# Patient Record
Sex: Male | Born: 1945 | Race: White | Hispanic: No | Marital: Married | Smoking: Former smoker
Health system: Southern US, Community
[De-identification: ages and names within clinical notes are randomized; demographics above are authoritative.]

## PROBLEM LIST (undated history)

## (undated) DIAGNOSIS — F329 Major depressive disorder, single episode, unspecified: Secondary | ICD-10-CM

## (undated) DIAGNOSIS — I209 Angina pectoris, unspecified: Secondary | ICD-10-CM

## (undated) DIAGNOSIS — F32A Depression, unspecified: Secondary | ICD-10-CM

## (undated) DIAGNOSIS — M1990 Unspecified osteoarthritis, unspecified site: Secondary | ICD-10-CM

## (undated) DIAGNOSIS — G20 Parkinson's disease: Secondary | ICD-10-CM

## (undated) DIAGNOSIS — I519 Heart disease, unspecified: Secondary | ICD-10-CM

## (undated) HISTORY — PX: SHOULDER SURGERY: SHX246

## (undated) HISTORY — DX: Major depressive disorder, single episode, unspecified: F32.9

## (undated) HISTORY — DX: Angina pectoris, unspecified: I20.9

## (undated) HISTORY — PX: KNEE ARTHROSCOPY: SHX127

## (undated) HISTORY — PX: APPENDECTOMY: SHX54

## (undated) HISTORY — DX: Heart disease, unspecified: I51.9

## (undated) HISTORY — PX: CARDIAC SURGERY: SHX584

## (undated) HISTORY — PX: TONSILLECTOMY: SHX5217

## (undated) HISTORY — DX: Unspecified osteoarthritis, unspecified site: M19.90

## (undated) HISTORY — PX: EYE SURGERY: SHX253

## (undated) HISTORY — DX: Depression, unspecified: F32.A

---

## 2002-11-26 DIAGNOSIS — I219 Acute myocardial infarction, unspecified: Secondary | ICD-10-CM

## 2004-08-03 ENCOUNTER — Ambulatory Visit: Payer: Self-pay | Admitting: Internal Medicine

## 2006-09-06 ENCOUNTER — Ambulatory Visit: Payer: Self-pay | Admitting: Unknown Physician Specialty

## 2006-12-15 ENCOUNTER — Ambulatory Visit: Payer: Self-pay

## 2008-09-09 ENCOUNTER — Ambulatory Visit: Payer: Self-pay | Admitting: Internal Medicine

## 2009-04-24 ENCOUNTER — Ambulatory Visit: Payer: Self-pay | Admitting: Internal Medicine

## 2009-07-24 ENCOUNTER — Encounter: Payer: Self-pay | Admitting: Internal Medicine

## 2010-04-30 ENCOUNTER — Ambulatory Visit: Payer: Self-pay | Admitting: Internal Medicine

## 2010-12-03 ENCOUNTER — Ambulatory Visit: Payer: Self-pay | Admitting: Unknown Physician Specialty

## 2010-12-15 ENCOUNTER — Other Ambulatory Visit: Payer: Self-pay

## 2010-12-16 ENCOUNTER — Ambulatory Visit: Payer: Self-pay | Admitting: Internal Medicine

## 2012-05-22 ENCOUNTER — Ambulatory Visit: Payer: Self-pay | Admitting: Internal Medicine

## 2012-07-05 ENCOUNTER — Ambulatory Visit: Payer: Self-pay | Admitting: Surgery

## 2012-07-12 ENCOUNTER — Ambulatory Visit: Payer: Self-pay | Admitting: Surgery

## 2012-07-17 ENCOUNTER — Ambulatory Visit: Payer: Self-pay | Admitting: Surgery

## 2012-07-19 LAB — PATHOLOGY REPORT

## 2012-10-30 DIAGNOSIS — I2510 Atherosclerotic heart disease of native coronary artery without angina pectoris: Secondary | ICD-10-CM | POA: Insufficient documentation

## 2012-10-30 DIAGNOSIS — H269 Unspecified cataract: Secondary | ICD-10-CM | POA: Insufficient documentation

## 2013-03-11 ENCOUNTER — Inpatient Hospital Stay: Payer: Self-pay | Admitting: Internal Medicine

## 2013-03-11 LAB — COMPREHENSIVE METABOLIC PANEL
Albumin: 3.4 g/dL (ref 3.4–5.0)
Alkaline Phosphatase: 90 U/L (ref 50–136)
Bilirubin,Total: 0.3 mg/dL (ref 0.2–1.0)
Calcium, Total: 8.6 mg/dL (ref 8.5–10.1)
Creatinine: 0.95 mg/dL (ref 0.60–1.30)
Osmolality: 275 (ref 275–301)
SGOT(AST): 16 U/L (ref 15–37)
SGPT (ALT): 9 U/L — ABNORMAL LOW (ref 12–78)
Sodium: 137 mmol/L (ref 136–145)
Total Protein: 6.4 g/dL (ref 6.4–8.2)

## 2013-03-11 LAB — CK TOTAL AND CKMB (NOT AT ARMC)
CK, Total: 48 U/L (ref 35–232)
CK-MB: <0.5 ng/mL — ABNORMAL LOW (ref 0.5–3.6)

## 2013-03-11 LAB — CBC
MCH: 29.5 pg (ref 26.0–34.0)
Platelet: 170 10*3/uL (ref 150–440)
RDW: 13.8 % (ref 11.5–14.5)

## 2013-03-12 LAB — CBC WITH DIFFERENTIAL/PLATELET
Basophil #: 0 10*3/uL (ref 0.0–0.1)
Basophil %: 0.4 %
Eosinophil #: 0.1 10*3/uL (ref 0.0–0.7)
HGB: 13 g/dL (ref 13.0–18.0)
Lymphocyte #: 0.9 10*3/uL — ABNORMAL LOW (ref 1.0–3.6)
Lymphocyte %: 20.8 %
MCH: 30.5 pg (ref 26.0–34.0)
MCHC: 34.4 g/dL (ref 32.0–36.0)
MCV: 89 fL (ref 80–100)
Monocyte %: 13.2 %
Neutrophil #: 2.7 10*3/uL (ref 1.4–6.5)
Neutrophil %: 63.8 %
RBC: 4.26 10*6/uL — ABNORMAL LOW (ref 4.40–5.90)
RDW: 14 % (ref 11.5–14.5)
WBC: 4.3 10*3/uL (ref 3.8–10.6)

## 2013-03-12 LAB — CK TOTAL AND CKMB (NOT AT ARMC)
CK, Total: 45 U/L (ref 35–232)
CK-MB: <0.5 ng/mL — ABNORMAL LOW (ref 0.5–3.6)

## 2013-03-12 LAB — BASIC METABOLIC PANEL
BUN: 9 mg/dL (ref 7–18)
Calcium, Total: 8.5 mg/dL (ref 8.5–10.1)
Chloride: 109 mmol/L — ABNORMAL HIGH (ref 98–107)
Creatinine: 0.88 mg/dL (ref 0.60–1.30)
EGFR (African American): >60
Osmolality: 280 (ref 275–301)
Sodium: 141 mmol/L (ref 136–145)

## 2013-03-12 LAB — LIPID PANEL
Ldl Cholesterol, Calc: 39 mg/dL (ref 0–100)
VLDL Cholesterol, Calc: 12 mg/dL (ref 5–40)

## 2013-03-13 LAB — CBC WITH DIFFERENTIAL/PLATELET
Basophil #: 0 10*3/uL (ref 0.0–0.1)
Basophil %: 0.3 %
Eosinophil %: 4 %
HCT: 37.1 % — ABNORMAL LOW (ref 40.0–52.0)
HGB: 12.6 g/dL — ABNORMAL LOW (ref 13.0–18.0)
Lymphocyte #: 1.4 10*3/uL (ref 1.0–3.6)
Lymphocyte %: 34.8 %
MCHC: 34.1 g/dL (ref 32.0–36.0)
MCV: 88 fL (ref 80–100)
Monocyte #: 0.5 x10 3/mm (ref 0.2–1.0)
Neutrophil #: 1.9 10*3/uL (ref 1.4–6.5)
Platelet: 156 10*3/uL (ref 150–440)
RBC: 4.2 10*6/uL — ABNORMAL LOW (ref 4.40–5.90)
RDW: 13.8 % (ref 11.5–14.5)
WBC: 4 10*3/uL (ref 3.8–10.6)

## 2013-03-13 LAB — BASIC METABOLIC PANEL
Anion Gap: 6 — ABNORMAL LOW (ref 7–16)
Calcium, Total: 8.5 mg/dL (ref 8.5–10.1)
Chloride: 110 mmol/L — ABNORMAL HIGH (ref 98–107)
Co2: 26 mmol/L (ref 21–32)
Glucose: 91 mg/dL (ref 65–99)
Osmolality: 281 (ref 275–301)
Potassium: 3.8 mmol/L (ref 3.5–5.1)

## 2013-04-18 ENCOUNTER — Ambulatory Visit: Payer: Self-pay | Admitting: Internal Medicine

## 2013-06-12 DIAGNOSIS — R443 Hallucinations, unspecified: Secondary | ICD-10-CM | POA: Insufficient documentation

## 2013-07-31 ENCOUNTER — Encounter: Payer: Self-pay | Admitting: Internal Medicine

## 2013-09-25 ENCOUNTER — Encounter: Payer: Self-pay | Admitting: Internal Medicine

## 2013-09-27 ENCOUNTER — Encounter: Payer: Self-pay | Admitting: Internal Medicine

## 2013-10-28 ENCOUNTER — Encounter: Payer: Self-pay | Admitting: Internal Medicine

## 2013-11-05 ENCOUNTER — Emergency Department: Payer: Self-pay | Admitting: Emergency Medicine

## 2013-11-05 LAB — COMPREHENSIVE METABOLIC PANEL
ALK PHOS: 83 U/L
ANION GAP: 4 — AB (ref 7–16)
Albumin: 3.6 g/dL (ref 3.4–5.0)
BUN: 17 mg/dL (ref 7–18)
Bilirubin,Total: 0.4 mg/dL (ref 0.2–1.0)
CALCIUM: 9.1 mg/dL (ref 8.5–10.1)
Chloride: 106 mmol/L (ref 98–107)
Co2: 28 mmol/L (ref 21–32)
Creatinine: 0.92 mg/dL (ref 0.60–1.30)
EGFR (African American): >60
Glucose: 96 mg/dL (ref 65–99)
Osmolality: 277 (ref 275–301)
POTASSIUM: 4 mmol/L (ref 3.5–5.1)
SGOT(AST): 16 U/L (ref 15–37)
SGPT (ALT): <6 U/L — ABNORMAL LOW (ref 12–78)
SODIUM: 138 mmol/L (ref 136–145)
Total Protein: 6.8 g/dL (ref 6.4–8.2)

## 2013-11-05 LAB — APTT: ACTIVATED PTT: 27.9 s (ref 23.6–35.9)

## 2013-11-05 LAB — SEDIMENTATION RATE: ERYTHROCYTE SED RATE: 4 mm/h (ref 0–20)

## 2013-11-05 LAB — TROPONIN I: Troponin-I: <0.02 ng/mL

## 2013-11-05 LAB — CBC
HCT: 41.6 % (ref 40.0–52.0)
HGB: 13.7 g/dL (ref 13.0–18.0)
MCH: 30 pg (ref 26.0–34.0)
MCHC: 32.9 g/dL (ref 32.0–36.0)
MCV: 91 fL (ref 80–100)
PLATELETS: 214 10*3/uL (ref 150–440)
RBC: 4.56 10*6/uL (ref 4.40–5.90)
RDW: 13.5 % (ref 11.5–14.5)
WBC: 10.1 10*3/uL (ref 3.8–10.6)

## 2013-11-25 ENCOUNTER — Encounter: Payer: Self-pay | Admitting: Internal Medicine

## 2014-04-18 DIAGNOSIS — D649 Anemia, unspecified: Secondary | ICD-10-CM | POA: Insufficient documentation

## 2014-07-16 ENCOUNTER — Encounter: Payer: Self-pay | Admitting: Internal Medicine

## 2014-12-24 ENCOUNTER — Encounter
Admit: 2014-12-24 | Disposition: A | Discharge status: Home / Self Care | Payer: Self-pay | Attending: Neurology | Admitting: Neurology

## 2014-12-27 ENCOUNTER — Encounter
Admit: 2014-12-27 | Disposition: A | Discharge status: Home / Self Care | Payer: Self-pay | Attending: Neurology | Admitting: Neurology

## 2015-01-14 NOTE — Op Note (Signed)
PATIENT NAME:  Consepcion HearingJOBE, [name] MR#:  [id number] DATE OF BIRTH:  [birth year]  DATE OF PROCEDURE:  07/17/2012  PREOPERATIVE DIAGNOSIS: Right subscapular elastoma.   POSTOPERATIVE DIAGNOSIS: Right subscapular elastoma.   PROCEDURE: Excision of elastoma of right upper back.   SURGEON: Renda RollsWilton Carlis Burnsworth, MD  ANESTHESIA: General.   INDICATIONS: This 69 year old male reports a one year history of having a mass which moves in and out of his scapula. He reports ti has been going on for a year and does cause mild painful discomfort, has been happening with increased frequency was certain bodily movements. A mass was palpable on examination, needle biopsy demonstrated elastoma, and surgery was recommended or definitive treatment.   DESCRIPTION OF PROCEDURE: The patient was placed on the stretcher, in the supine position, under general endotracheal anesthesia. He was then moved over onto the operating table, in the left lateral decubitus position, using beanbag cushion for padding and activated the cushion, placed his right arm on a lateral arm rest, and a shoulder roll was used as well. The right subscapular region of the back was prepared with ChloraPrep and draped in a sterile manner.   A transversely oriented incision was made approximately some 8 cm in length and carried down through subcutaneous tissues through superficial fascia and encountered the latissimus dorsi muscle. The muscle fibers were spread along the course of the fibers exposing the trapezius which was also spread along the course of its fibers and dissection was carried down to encounter a subscapular mass which was dissected free from surrounding structures. This mass was excised. Electrocautery was used for hemostasis. The ex vivo measurement was some 11 cm x 8 cm x 2 cm. It was submitted in formalin for routine pathology. The wound was inspected. Several small bleeding points were cauterized. The subcutaneous tissues were infiltrated with 0.5%  Sensorcaine with epinephrine and also several places deep within the wound adjacent to cauterized points were infiltrated as well. A total of 30 mL was used. Next, the trapezius was closed with 3-0 Monocryl. The latissimus dorsi was closed with 3-0 Monocryl. The superficial fascia was closed with 3-0 and 4-0 Monocryl. The skin was closed with a running 4-0 Monocryl subcuticular suture and Dermabond. The patient tolerated the procedure satisfactorily and was then prepared for transfer to the recovery room. ____________________________ Shela CommonsJ. Renda RollsWilton Damarea Merkel, MD jws:slb D: 07/17/2012 09:49:31 ET T: 07/17/2012 10:24:57 ET JOB#: [id number]  cc: Adella HareJ. Wilton Trentin Knappenberger, MD, <Dictator> Adella HareWILTON J Shlomo Seres MD ELECTRONICALLY SIGNED 07/17/2012 20:49

## 2015-01-17 NOTE — H&P (Signed)
PATIENT NAME:  [name], SCHIFF MR#:  [id number] DATE OF BIRTH:  01/23/1946  DATE OF ADMISSION:  03/11/2013  REFERRING PHYSICIAN:  Dr. Mindi Junker  FAMILY PHYSICIAN:  Dr. Judithann Sheen  REASON FOR ADMISSION:  Unstable angina.   HISTORY OF PRESENT ILLNESS: The patient is a 69 year old male with a history of Parkinson's disease and coronary artery disease, status post CABG, with multiple subsequent PTCAs with stent placements. Remains on aspirin and Plavix. Presents to the Emergency Room today with new onset of chest pain at rest associated with shortness of breath, diaphoresis, and near syncope. Some nausea, but no vomiting. In the Emergency Room, the patient's EKG and enzymes were nonacute. He is now admitted for further evaluation. The patient was a 9 out of 10, substernal, without radiation. It was made worse with exertion, but came on at rest. Associated factors as noted above. Currently, the patient is down to 3 out of 10 chest pain after nitroglycerin.   PAST MEDICAL HISTORY: 1.  ASCVD, status post CABG.   2.  Previous MI.   3.  Status post multiple stent placements.  4.  Parkinson's disease.  5.  Chronic tinnitus.  6.  Chronic dyspnea.  7.  Benign hypertension.  8.  Hyperlipidemia. 9.  Peripheral arterial disease.  10.  Status post bilateral knee surgeries.   MEDICATIONS: 1.  Wellbutrin 75 mg p.o. daily.  2.  Sinemet CR 25/100, 1 p.o. at bedtime.  3.  Sinemet 25/100, 2 p.o. q. morning, 2 p.o. at 11:00 a.m., 2 p.o. at 2:00 p.m., 1-1/2 at 4:00 p.m., 1 at 7:00 p.m.  4.  Requip 1 mg p.o. b.i.d.  5.  Plavix 75 mg p.o. daily.  6.  Prilosec 20 mg p.o. b.i.d.  7.  Crestor 5 mg p.o. at bedtime.  8.  Celebrex 200 mg p.o. daily.  9.  Aspirin 81 mg p.o. daily.   ALLERGIES:  No known drug allergies.   SOCIAL HISTORY:  The patient is married, with no history of alcohol abuse. He is a former smoker, but quit greater than 10 years ago.   FAMILY HISTORY: Positive for hypertension, diabetes, stroke, and  coronary artery disease. Negative for prostate or colon cancer.   REVIEW OF SYSTEMS:  CONSTITUTIONAL: No fever or change in weight.  EYES: No blurry or double vision. No glaucoma.  EARS, NOSE, THROAT:  Positive tenderness, which is chronic. No nasal discharge or bleeding. No difficulty swallowing.  RESPIRATORY:  No cough or wheezing. Denies hemoptysis. No painful respiration.  CARDIOVASCULAR: No orthopnea or palpitations. No actual syncope, although he did complain of near-syncope.  GASTROINTESTINAL:  Nausea, but no vomiting. No diarrhea. No abdominal pain or change in bowel habits.  GENITOURINARY:  No dysuria or hematuria. No incontinence.  ENDOCRINE:  No polyuria or polydipsia. No heat or cold intolerance.  HEMATOLOGIC:  The patient denies anemia, easy bruising, or bleeding.  LYMPHATIC:  No swollen glands.  MUSCULOSKELETAL:  The patient denies pain in his neck, back, shoulders or hips. Does have knee pain. Denies gout.  NEUROLOGIC:  No numbness or weakness. Denies migraines, stroke or seizures.  PSYCHIATRIC:  The patient denies anxiety, insomnia, or depression.   PHYSICAL EXAMINATION: GENERAL: The patient is in no acute distress.  VITAL SIGNS:  Currently remarkable for a blood pressure of 150/94, with a heart rate of 71 and a respiratory rate of 18. He is afebrile.  HEENT: Normocephalic, atraumatic. Pupils equally round and reactive to light and accommodation. Extraocular movements are intact. Sclerae are not  icteric. Conjunctivae are clear. Oropharynx is clear.  NECK: Supple, without JVD or bruits. No adenopathy or thyromegaly is noted.  LUNGS: Clear to auscultation and percussion, without wheezes, rales or rhonchi. Respiratory effort is normal. No dullness.  CARDIAC EXAM:  Regular rate and rhythm, with a normal S1, S2. No significant rubs, murmurs or gallops. PMI is nondisplaced. Chest wall is nontender.  ABDOMEN: Soft, nontender, with normoactive bowel sounds. No organomegaly or masses  were appreciated. No hernias or bruits were noted.  EXTREMITIES: Without clubbing, cyanosis, edema. Pulses were 2+ bilaterally.  SKIN: Warm and dry, without rash or lesions.  NEUROLOGIC EXAM: Revealed cranial nerves II through XII grossly intact. Deep tendon reflexes were symmetric. Motor and sensory exam is nonfocal.  PSYCHIATRIC EXAM:  Revealed a patient who is alert and oriented to person, place and time. He was cooperative and used good judgment.   LABORATORY DATA:  EKG revealed sinus rhythm with no acute ischemic changes. Chest x-ray was unremarkable. CBC was remarkable for a white count of 5.9, with a hemoglobin of 12.5. Glucose was 118, with a BUN of 13, creatinine of 0.95 and a GFR of greater than 60. Potassium was 4.0. Troponin less than 0.02.   ASSESSMENT: 1.  Chest pain, worrisome for unstable angina.  2.  Atherosclerotic cardiovascular disease, status post coronary artery bypass graft.  3.  Parkinson's disease.  4.  Hyperglycemia.  5.  Chronic anemia.  6.  Benign hypertension.  7.  Hyperlipidemia.   PLAN: The patient will be admitted to telemetry with aspirin, Plavix and Lovenox. Will begin topical nitrates and oral beta blocker therapy. Will follow serial cardiac enzymes and obtain an echocardiogram. Will also consult Cardiology for possible cardiac catheterization, given his symptoms at rest. Will obtain carotid Dopplers because of his near-syncope. Continue his Parkinson's drugs. Follow up routine labs in the morning. Further treatment and evaluation will depend upon the patient's progress.   Total time spent on this patient was 50 minutes.    ____________________________ Duane LopeJeffrey D. Judithann SheenSparks, MD jds:mr D: 03/11/2013 13:00:48 ET T: 03/11/2013 21:00:48 ET JOB#: [id number]  cc: Duane LopeJeffrey D. Judithann SheenSparks, MD, <Dictator> Treyveon Mochizuki Rodena Medin Donivan Thammavong MD ELECTRONICALLY SIGNED 03/11/2013 22:22

## 2015-01-17 NOTE — Discharge Summary (Signed)
PATIENT NAME:  Consepcion [name], [name] MR#:  [id number] DATE OF BIRTH:  1946/07/15  DATE OF ADMISSION:  03/11/2013 DATE OF DISCHARGE:  03/13/2013  REASON FOR ADMISSION: Unstable angina.   HISTORY OF PRESENT ILLNESS: Please see the dictated HPI done by myself on 03/11/2013.   PAST MEDICAL HISTORY: 1.  ASCVD status post CABG.  2.  Status post multiple stent placements.  3.  Previous MI. 4.  Parkinson's disease.  5.  Chronic tinnitus.  6.  Chronic dyspnea. 7.  Benign hypertension.  8.  Hyperlipidemia.  9.  Peripheral arterial disease.  10.  Status post bilateral knee surgeries.   MEDICATIONS ON ADMISSION: Please see admission note.   ALLERGIES: No known drug allergies.   SOCIAL HISTORY, FAMILY HISTORY AND REVIEW OF SYSTEMS: As per admission note.  PHYSICAL EXAMINATION: The patient was in no acute distress. Vital signs were stable and he was afebrile. HEENT exam was unremarkable. Neck was supple without JVD. Lungs were clear. Cardiac exam revealed a regular rate and rhythm with normal S1 and S2. Abdomen was soft and nontender. Extremities were without edema. Neurologic exam was grossly nonfocal.   HOSPITAL COURSE: The patient was admitted with chest pain worrisome for unstable angina. Initial EKG revealed no acute ischemic changes. Initial troponin was negative. He was seen in consultation by cardiology and cardiac cath was recommended for his symptoms. He was maintained on aspirin, Plavix and Lovenox, as well as beta blocker therapy and topical nitrates. The patient remained stable in the hospital. He underwent cardiac catheterization, which revealed no changes in his underlying coronary anatomy. His grafts remained patent and his native vessels remained unchanged. Medical management was recommended. The patient did have some presyncopal symptoms and underwent carotid Dopplers, which were unremarkable. The patient was observed overnight after his cardiac catheterization. He ambulated well without  further symptoms. By 03/13/2013, the patient was stable and ready for discharge.   DISCHARGE DIAGNOSES: 1.  Unstable angina.  2.  Near syncope.  3.  Atherosclerotic cardiovascular disease status post coronary artery bypass graft.  4.  Parkinson's disease.   DISCHARGE MEDICATIONS: 1.  Sinemet CR 25/100 mg 1 p.o. q. p.m.  2.  Sinemet 25/100 mg as directed.  3.  Omeprazole 20 mg p.o. [name].i.d.  4.  Aspirin 81 mg p.o. daily.  5.  Plavix 75 mg p.o. daily.  6.  Requip 1 mg p.o. [name].i.d.  7.  Wellbutrin 75 mg p.o. daily.  8.  Celebrex 200 mg p.o. daily.  9.  Imdur 30 mg p.o. daily.  10.  Nitrostat 0.4 mg sublingually p.r.n. chest pain.  11.  Crestor 5 mg p.o. daily.  12.  Plavix 75 mg p.o. daily.   FOLLOW-UP PLANS AND APPOINTMENTS: The patient was discharged on a low sodium diet. He will follow up with Dr. Gwen PoundsKowalski in 1 to 2 weeks, sooner if needed.   TOTAL TIME SPENT ON THIS DICTATION: 45 minutes.  ____________________________ Duane LopeJeffrey D. Judithann SheenSparks, MD jds:sb D: 03/27/2013 08:07:49 ET T: 03/27/2013 08:28:29 ET JOB#: [id number]  cc: Duane LopeJeffrey D. Judithann SheenSparks, MD, <Dictator> Ryoma Nofziger Rodena Medin Adali Pennings MD ELECTRONICALLY SIGNED 03/27/2013 10:19

## 2015-01-17 NOTE — Consult Note (Signed)
Brief Consult Note: Diagnosis: [place] CAD Syncope.   Patient was seen by consultant.   Consult note dictated.   Recommend to proceed with surgery or procedure.   Recommend further assessment or treatment.   Orders entered.   Discussed with Attending MD.   Comments: IMP [place] CAD  HTN Hyperchol Obesity Syncope Parkinsons Anemia . PLAN ROMI Tele ASA 81 mg qd Plavix 75 mg daily Continue Crestor Agree with Sinemet Cath today for [place].  Electronic Signatures: Dorothyann Pengallwood, Jasper Hanf D (MD)  (Signed 16-Jun-14 08:29)  Authored: Brief Consult Note   Last Updated: 16-Jun-14 08:29 by Dorothyann Pengallwood, Blanton Kardell D (MD)

## 2015-01-17 NOTE — Consult Note (Signed)
Chief Complaint:  Subjective/Chief Complaint Pt states no further cp no syncope resting in bed without further symtoms.   VITAL SIGNS/ANCILLARY NOTES: **Vital Signs.:   17-Jun-14 09:09  Vital Signs Type Routine  Temperature Temperature (F) 97.7  Celsius 36.5  Pulse Pulse 64  Respirations Respirations 18  Systolic BP Systolic BP 185  Diastolic BP (mmHg) Diastolic BP (mmHg) 91  Mean BP 116  Pulse Ox % Pulse Ox % 97  Pulse Ox Activity Level  At rest  Oxygen Delivery Room Air/ 21 %  *Intake and Output.:   Daily 17-Jun-14 07:00  Grand Totals Intake:  2290 Output:  6314    Net:  735 24 Hr.:  970  Oral Intake      In:  790  IV (Primary)      In:  1500  Urine ml     Out:  1555  Length of Stay Totals Intake:  3190 Output:  2637    Net:  8588   Brief Assessment:  GEN well developed, well nourished, no acute distress   Cardiac Regular  murmur present  -- LE edema  --Rub   Respiratory normal resp effort  clear BS   Gastrointestinal Normal   Gastrointestinal details normal Soft   EXTR negative cyanosis/clubbing, negative edema   Lab Results: LabObservation:  16-Jun-14 13:01   OBSERVATION Reason for Test  Lab:  16-Jun-14 04:00   O2 Saturation (Pulse Ox) 94 (Result(s) reported on 12 Mar 2013 at 04:51AM.)  Cardiology:  16-Jun-14 13:01   Echo Doppler REASON FOR EXAM:     COMMENTS:     PROCEDURE: [facility] - ECHO DOPPLER COMPLETE(TRANSTHOR)  - Mar 12 2013  1:01PM   RESULT: Echocardiogram Report  Patient Name:   [name] Date of Exam: 03/12/2013 Medical Rec #:  [id number]            Custom1: Date of Birth:  24-Aug-1946        Height:       72.0 in Patient Age:    69 years          Weight:       201.0 lb Patient Gender: M                 BSA:          2.13 m??  Indications: Syncope Sonographer:    Janalee Dane RCS Referring Phys: Fulton Reek, D  Summary:  1. Left ventricular ejection fraction, by visual estimation, is 50 to  55%.  2. Normal global left  ventricular systolic function.  3. Mild mitral valve regurgitation.  4. Mild tricuspid regurgitation. 2D AND M-MODEMEASUREMENTS (normal ranges within parentheses): Left Ventricle:          Normal IVSd (2D):      1.09 cm (0.7-1.1) LVPWd (2D):     0.90 cm (0.7-1.1) Aorta/LA:                  Normal LVIDd (2D):     5.19 cm (3.4-5.7) Aortic Root (2D): 3.70 cm (2.4-3.7) LVIDs (2D):     3.67 cm           Left Atrium (2D): 4.10 cm (1.9-4.0) LV FS (2D):     29.3 %   (>25%) LV EF (2D):     55.8 %   (>50%)  Right Ventricle:                                   RVd (2D): LV DIASTOLIC FUNCTION: MV Peak E: 0.71 m/s E/e' Ratio: 10.30 MV Peak A: 0.65 m/s Decel Time: 335 msec E/A Ratio: 1.09 SPECTRAL DOPPLER ANALYSIS (where applicable): Mitral Valve: MV P1/2 Time: 97.15 msec MV Area, PHT: 2.26 cm??  PHYSICIAN INTERPRETATION: Left Ventricle: The left ventricular internal cavity size was normal. LV  posterior wall thickness was normal. Global LV systolic function was   normal. Left ventricular ejection fraction, by visual estimation, is 50  to 55%. Right Ventricle: Normal right ventricular size, wall thickness, and  systolic function. RV wall thickness is normal. Left Atrium: The left atrium is normal in size and structure. Right Atrium: The right atrium is normal in size and structure. Pericardium: There isno evidence of pericardial effusion. Mitral Valve: Mild mitral valve regurgitation is seen. Tricuspid Valve: Mild tricuspid regurgitation is visualized. Aortic Valve: The aortic valve is trileaflet and structurally normal,  with normal leaflet excursion; without any evidence of aortic stenosis or  insufficiency. Pulmonic Valve: Structurally normal pulmonic valve, with normal leaflet  excursion. Aorta: The aortic root and ascending aorta are structurally normal, with   no evidence of dilitation. Shunts: There is no evidence of a patent foramen ovale. There is  no  evidence of an atrial septal defect.  [id number] Serafina Royals MD Electronically signed by [id number] Serafina Royals MD Signature Date/Time: 03/12/2013/5:35:42 PM  *** Final ***  IMPRESSION: .    Verified By: Corey Skains  ([facility]), M.D., MD  Routine Chem:  16-Jun-14 01:19   Glucose, Serum 91  BUN 9  Creatinine (comp) 0.88  Sodium, Serum 141  Potassium, Serum 3.7  Chloride, Serum  109  CO2, Serum 31  Calcium (Total), Serum 8.5  Anion Gap  1  Osmolality (calc) 280  eGFR (African American) >60  eGFR (Non-African American) >60 (eGFR values <105m/min/1.73 m2 may be an indication of chronic kidney disease (CKD). Calculated eGFR is useful in patients with stable renal function. The eGFR calculation will not be reliable in acutely ill patients when serum creatinine is changing rapidly. It is not useful in  patients on dialysis. The eGFR calculation may not be applicable to patients at the low and high extremes of body sizes, pregnant women, and vegetarians.)  Cholesterol, Serum 96  Triglycerides, Serum 60  HDL (INHOUSE) 45  VLDL Cholesterol Calculated 12  LDL Cholesterol Calculated 39 (Result(s) reported on 12 Mar 2013 at 01:42AM.)  Cardiac:  16-Jun-14 01:19   CK, Total 45  CPK-MB, Serum  < 0.5 (Result(s) reported on 12 Mar 2013 at 01:56AM.)  Troponin I < 0.02 (0.00-0.05 0.05 ng/mL or less: NEGATIVE  Repeat testing in 3-6 hrs  if clinically indicated. >0.05 ng/mL: POTENTIAL  MYOCARDIAL INJURY. Repeat  testing in 3-6 hrs if  clinically indicated. NOTE: An increase or decrease  of 30% or more on serial  testing suggests a  clinically important change)  Routine Hem:  16-Jun-14 01:19   WBC (CBC) 4.3  RBC (CBC)  4.26  Hemoglobin (CBC) 13.0  Hematocrit (CBC)  37.7  Platelet Count (CBC) 160  MCV 89  MCH 30.5  MCHC 34.4  RDW 14.0  Neutrophil % 63.8  Lymphocyte % 20.8  Monocyte % 13.2  Eosinophil % 1.8  Basophil % 0.4  Neutrophil # 2.7  Lymphocyte #  0.9  Monocyte # 0.6  Eosinophil # 0.1  Basophil # 0.0 (Result(s) reported on 12 Mar 2013 at 02:03AM.)   Radiology Results: XRay:    15-Jun-14 09:17, Chest Portable Single View  Chest Portable Single View   REASON FOR EXAM:    Chest Pain  COMMENTS:       PROCEDURE: DXR - DXR PORTABLE CHEST SINGLE VIEW  - Mar 11 2013  9:17AM     RESULT: Cardiac monitoring electrodes present. Sternotomy wires present.   The lungs are clear. The heart and pulmonary vesselsare normal. The bony   and mediastinal structures are unremarkable. There is no effusion. There   is no pneumothorax or evidence of congestive failure.    IMPRESSION:  No acute cardiopulmonary disease.    Dictation Site: 6      Verified By: Sundra Aland, M.D., MD  [place]:    15-Jun-14 13:59, US Carotid Doppler Bilateral  US Carotid Doppler Bilateral   REASON FOR EXAM:    near syncope  COMMENTS:       PROCEDURE: [place]  - US CAROTID DOPPLER BILATERAL  - Mar 11 2013  1:59PM     RESULT: Carotid Doppler interrogation is performed in the standard   fashion. The study shows atherosclerotic changes in the carotid systems   bilaterally most prominent in the bulb regions. There is calcified plaque   in the right carotid bulb with less than 50% narrowing on the grayscale   images. There is some scattered plaque in the left carotid bulb and in   both proximal internal carotids again without significant visual evidence   of stenosis. The color Doppler and spectral Doppler appearance is normal   bilaterally. There is antegrade flow seen in both vertebral arteries   without evidence of flow reversal. The peak systolic velocities are   normal within both carotid systems. The internal to common carotid peak     systolic velocity ratio is 0.83 on the right and 0.73 on the left.    IMPRESSION:  Atherosclerotic disease without evidence of hemodynamically   significant stenosis.    Dictation Site: 6        Verified By: Sundra Aland,  M.D., MD  Cardiology:    15-Jun-14 09:01, ED ECG  Ventricular Rate 74  Atrial Rate 74  P-R Interval 180  QRS Duration 100  QT 416  QTc 461  P Axis 32  R Axis -22  T Axis 69  ECG interpretation   Normal sinus rhythm  Low voltage QRS  Cannot rule out Anterior infarct , age undetermined  Abnormal ECG  When compared with ECG of 16-Dec-2010 14:19,  No significant change was found  ----------unconfirmed----------  Confirmed by OVERREAD, NOT ([id number]),editor PEARSON, BARBARA ([id number]) on 03/12/2013 9:47:58 AM  ED ECG     16-Jun-14 13:01, Echo Doppler  Echo Doppler   REASON FOR EXAM:      COMMENTS:       PROCEDURE: [facility] - ECHO DOPPLER COMPLETE(TRANSTHOR)  - Mar 12 2013  1:01PM     RESULT: Echocardiogram Report    Patient Name:   [name] Date of Exam: 03/12/2013  Medical Rec #:  [id number]            Custom1:  Date of Birth:  14-Nov-1945        Height:       72.0 in  Patient Age:    42 years          Weight:  201.0 lb  Patient Gender: M                 BSA:          2.13 m??    Indications: Syncope  Sonographer:    Janalee Dane RCS  Referring Phys: Fulton Reek, D    Summary:   1. Left ventricular ejection fraction, by visual estimation, is 50 to   55%.   2. Normal global left ventricular systolic function.   3. Mild mitral valve regurgitation.   4. Mild tricuspid regurgitation.  2D AND M-MODEMEASUREMENTS (normal ranges within parentheses):  Left Ventricle:          Normal  IVSd (2D):      1.09 cm (0.7-1.1)  LVPWd (2D):     0.90 cm (0.7-1.1) Aorta/LA:                  Normal  LVIDd (2D):     5.19 cm (3.4-5.7) Aortic Root (2D): 3.70 cm (2.4-3.7)  LVIDs (2D):     3.67 cm           Left Atrium (2D): 4.10 cm (1.9-4.0)  LV FS (2D):     29.3 %   (>25%)  LV EF (2D):     55.8 %   (>50%)                                    Right Ventricle:                                    RVd (2D):  LV DIASTOLIC FUNCTION:  MV Peak E: 0.71 m/s E/e' Ratio: 10.30  MV Peak A: 0.65 m/s Decel  Time: 335 msec  E/A Ratio: 1.09  SPECTRAL DOPPLER ANALYSIS (where applicable):  Mitral Valve:  MV P1/2 Time: 97.15 msec  MV Area, PHT: 2.26 cm??    PHYSICIAN INTERPRETATION:  Left Ventricle: The left ventricular internal cavity size was normal. LV   posterior wall thickness was normal. Global LV systolic function was     normal. Left ventricular ejection fraction, by visual estimation, is 50   to 55%.  Right Ventricle: Normal right ventricular size, wall thickness, and   systolic function. RV wall thickness is normal.  Left Atrium: The left atrium is normal in size and structure.  Right Atrium: The right atrium is normal in size and structure.  Pericardium: There isno evidence of pericardial effusion.  Mitral Valve: Mild mitral valve regurgitation is seen.  Tricuspid Valve: Mild tricuspid regurgitation is visualized.  Aortic Valve: The aortic valve is trileaflet and structurally normal,   with normal leaflet excursion; without any evidence of aortic stenosis or   insufficiency.  Pulmonic Valve: Structurally normal pulmonic valve, with normal leaflet   excursion.  Aorta: The aortic root and ascending aorta are structurally normal, with     no evidence of dilitation.  Shunts: There is no evidence of a patent foramen ovale. There is no   evidence of an atrial septal defect.    [id number] Serafina Royals MD  Electronically signed by [id number] Serafina Royals MD  Signature Date/Time: 03/12/2013/5:35:42 PM    *** Final ***    IMPRESSION: .        Verified By: Corey Skains  ([facility]), M.D., MD   Assessment/Plan:  Invasive Device Daily Assessment of  Necessity:  Does the patient currently have any of the following indwelling devices? none   Assessment/Plan:  Assessment IMP [place] CAD Syncope HTN Parkinsons Hyperlipidemia .   Plan PLAN Rec cardiac cath for [place] Continue ASA for CAD Agree with Bp control for HTN Continue parkinson meds  Statins for hyperlipidemia EF 50-55%  on ECHO Increase activity after cath Consider holter as outpt Have pt f/u with Nehemiah Massed after d/c   Electronic Signatures: Lujean Amel D (MD)  (Signed 17-Jun-14 17:36)  Authored: Chief Complaint, VITAL SIGNS/ANCILLARY NOTES, Brief Assessment, Lab Results, Radiology Results, Assessment/Plan   Last Updated: 17-Jun-14 17:36 by Lujean Amel D (MD)

## 2015-01-27 ENCOUNTER — Ambulatory Visit: Payer: Medicare Other | Admitting: Physical Therapy

## 2015-01-28 ENCOUNTER — Ambulatory Visit: Payer: Medicare Other | Attending: Neurology | Admitting: Physical Therapy

## 2015-01-28 ENCOUNTER — Encounter: Payer: Self-pay | Admitting: Physical Therapy

## 2015-01-28 DIAGNOSIS — R262 Difficulty in walking, not elsewhere classified: Secondary | ICD-10-CM | POA: Insufficient documentation

## 2015-01-28 DIAGNOSIS — M545 Low back pain: Secondary | ICD-10-CM | POA: Diagnosis not present

## 2015-01-28 NOTE — Therapy (Signed)
[place] [facility] [address] [place], [place], [address] Phone: [phone]   Fax:  [phone]  Physical Therapy Treatment  Patient Details  Name: [name] B Tullier MRN: [id number] Date of Birth: [birth year] Referring Provider:  Lonell FaceShah, Hemang K, MD  Encounter Date: 01/28/2015      PT End of Session - 01/28/15 1554    Visit Number 17   Number of Visits 26   Date for PT Re-Evaluation 02/18/15   PT Start Time 1450   PT Stop Time 1550   PT Time Calculation (min) 60 min   Activity Tolerance Patient limited by pain      Past Medical History  Diagnosis Date  . Depression   . Heart disease   . Arthritis     back  . Angina pectoris     Past Surgical History  Procedure Laterality Date  . Appendectomy    . Tonsillectomy    . Eye surgery      cataracts  . Knee arthroscopy Bilateral   . Shoulder surgery Right     benign mass removed  . Cardiac surgery      stents on 12/13/02; 09/09/08; 12/16/10    There were no vitals filed for this visit.  Visit Diagnosis:  Difficulty in walking      Subjective Assessment - 01/28/15 1534    Subjective "Not doing exercises because my back has been hurting lately"   Patient is accompained by: Family member   Currently in Pain? Yes   Pain Score 6    Pain Location Back   Pain Orientation Lower;Other (Comment)  central   Pain Descriptors / Indicators Burning   Pain Type Chronic pain   Pain Onset More than a month ago   Pain Frequency Constant                          Floor to ceiling 10 reps   Side to side 10 reps   Step and Reach Forward 10 reps   Step and Reach Backward 10 reps   Step and Reach Sideways 10 reps   Rock and Reach Forward/Backward 10 reps   Rock and Reach Sideways 10 reps   Functional tasks:1.  Sit to stand 10 reps   2.ascending/descending steps 10 reps   3. wallet in/out of pocket 10 reps   4.lifting legs over a  stool 10 reps   5.putting on and off coat 10 reps                       PT Education - 01/28/15 1553    Education provided Yes   Person(s) Educated Patient   Methods Demonstration;Explanation;Verbal cues   Comprehension Returned demonstration          PT Short Term Goals - 01/28/15 1606    PT SHORT TERM GOAL #3   Title --           PT Long Term Goals - 01/28/15 1605    PT LONG TERM GOAL #1   Title Patient will complete a TUG test in < 12 seconds without an AD for independent mobility and decreased fall risk    Status On-going   PT LONG TERM GOAL #2   Title Patient (> 70 years old) will increase single limb stance time to > 14 seconds to reduce fall risk during single limb stance    Status On-going   PT LONG  TERM GOAL #3   Title Patient will be independent with home exercise program for decreased fall risk and improve safety with functional activities    Status On-going   PT LONG TERM GOAL #4   Title Patient will be able to reach > 12 inches without loss of balance to reduce fall risk with functional activities   Status On-going               Plan - 01-31-15 1559    Clinical Impression Statement Patient need cueing for BIG arm swing, Patient needs cueing for BIG swing and adding dual cognitive loads and distracters.   Pt will benefit from skilled therapeutic intervention in order to improve on the following deficits Decreased balance;Difficulty walking;Decreased endurance   Rehab Potential Good   PT Frequency 4x / week   PT Duration 4 weeks   PT Treatment/Interventions Gait training;Therapeutic exercise;Therapeutic activities   Consulted and Agree with Plan of Care Patient          G-Codes - 2015/01/31 1613    Functional Assessment Tool Used clinical judgement, gait ability and strength   Functional Limitation Mobility: Walking and moving around   Mobility: Walking and Moving Around Current Status [phone]) At least 20 percent but less than  40 percent impaired, limited or restricted   Mobility: Walking and Moving Around Goal Status [phone]) At least 1 percent but less than 20 percent impaired, limited or restricted      Problem List There are no active problems to display for this patient. Ezekiel Ina, PT, DPT 2015-01-31, 4:17 PM  [facility] MAIN [facility] SERVICES [address] [place], [place], [address] Phone: [phone]   Fax:  [phone]

## 2015-01-29 ENCOUNTER — Encounter: Payer: Self-pay | Admitting: Physical Therapy

## 2015-01-29 ENCOUNTER — Ambulatory Visit: Payer: Medicare Other | Admitting: Physical Therapy

## 2015-01-29 DIAGNOSIS — R262 Difficulty in walking, not elsewhere classified: Secondary | ICD-10-CM | POA: Diagnosis not present

## 2015-01-29 NOTE — Therapy (Signed)
[facility] [address] [place], [place], [address] Phone: [phone]   Fax:  [phone]  Physical Therapy Treatment  Patient Details  Name: [name] MRN: [id number] Date of Birth: June 05, 1946 Referring Provider:  Lonell FaceShah, Hemang K, MD  Encounter Date: 01/29/2015      PT End of Session - 01/29/15 1332    Visit Number 18   Number of Visits 26   Date for PT Re-Evaluation 02/18/15   PT Start Time 1300   PT Stop Time 1355   PT Time Calculation (min) 55 min   Equipment Utilized During Treatment Gait belt   Activity Tolerance No increased pain   Behavior [facility] for tasks assessed/performed      Past Medical History  Diagnosis Date  . Depression   . Heart disease   . Arthritis     back  . Angina pectoris     Past Surgical History  Procedure Laterality Date  . Appendectomy    . Tonsillectomy    . Eye surgery      cataracts  . Knee arthroscopy Bilateral   . Shoulder surgery Right     benign mass removed  . Cardiac surgery      stents on 12/13/02; 09/09/08; 12/16/10    There were no vitals filed for this visit.  Visit Diagnosis:  Difficulty in walking      Subjective Assessment - 01/29/15 1317    Subjective Patient reports that his back is still sore and also his chest waa a little sore from lifting things at home.    Patient is accompained by: Family member          Floor to ceiling 10 reps   Side to side 10 reps   Step and Reach Forward 10 reps   Step and Reach Backward 10 reps   Step and Reach Sideways 10 reps   Rock and Reach Forward/Backward 10 reps   Rock and Reach Sideways 10 reps   Functional tasks:1.  Sit to stand 10 reps   2.Reaching with cones to duplicate putting dishes away 10 reps   3. hand coordination tasks 10 reps   4.lifting legs over a stool 10 reps   5.putting on and off coat 10 reps                                PT Education - 01/29/15 1332    Person(s) Educated Patient   Methods Explanation;Demonstration   Comprehension Returned demonstration;Verbalized understanding          PT Short Term Goals - 01/28/15 1606    PT SHORT TERM GOAL #3   Title --           PT Long Term Goals - 01/28/15 1605    PT LONG TERM GOAL #1   Title Patient will complete a TUG test in < 12 seconds without an AD for independent mobility and decreased fall risk    Status On-going   PT LONG TERM GOAL #2   Title Patient (> 70 years old) will increase single limb stance time to > 14 seconds to reduce fall risk during single limb stance    Status On-going   PT LONG TERM GOAL #3   Title Patient will be independent with home exercise program for decreased fall risk and improve safety with functional activities    Status On-going   PT  LONG TERM GOAL #4   Title Patient will be able to reach > 12 inches without loss of balance to reduce fall risk with functional activities   Status On-going               Plan - 01/28/15 1559    Clinical Impression Statement Patient need cueing for BIG arm swing, Patient needs cueing for BIG swing and adding dual cognitive loads and distracters.   Pt will benefit from skilled therapeutic intervention in order to improve on the following deficits Decreased balance;Difficulty walking;Decreased endurance   Rehab Potential Good   PT Frequency 4x / week   PT Duration 4 weeks   PT Treatment/Interventions Gait training;Therapeutic exercise;Therapeutic activities   Consulted and Agree with Plan of Care Patient          G-Codes - 01/28/15 1613    Functional Assessment Tool Used clinical judgement, gait ability and strength   Functional Limitation Mobility: Walking and moving around   Mobility: Walking and Moving Around Current Status [phone]([id number]) At least 20 percent but less than 40 percent impaired, limited or restricted    Mobility: Walking and Moving Around Goal Status [phone]([id number]) At least 1 percent but less than 20 percent impaired, limited or restricted      Problem List There are no active problems to display for this patient.   [name], PT, DPT 01/29/2015, 2:10 PM  [place] [facility] [address] [place], [place], [address] Phone: [phone]   Fax:  [phone]

## 2015-01-30 ENCOUNTER — Ambulatory Visit: Payer: Medicare Other | Admitting: Physical Therapy

## 2015-02-03 ENCOUNTER — Ambulatory Visit: Payer: Medicare Other | Admitting: Physical Therapy

## 2015-02-03 ENCOUNTER — Encounter: Payer: Self-pay | Admitting: Physical Therapy

## 2015-02-03 NOTE — Therapy (Signed)
[place] MAIN [facility] SERVICES [address] [place], [place], [address] Phone: [phone]   Fax:  [phone]  Patient Details  Name: [name] MRN: [id number] Date of Birth: [birth year] Referring Provider:  No ref. provider found  Encounter Date: 02/03/2015  PHYSICAL THERAPY DISCHARGE SUMMARY  Visits from Start of Care: 18  Current functional level related to goals / functional outcomes:     PT Long Term Goals - 01/28/15 1605    PT LONG TERM GOAL #1   Title Patient will complete a TUG test in < 12 seconds without an AD for independent mobility and decreased fall risk    Status On-going   PT LONG TERM GOAL #2   Title Patient (> 32 years old) will increase single limb stance time to > 14 seconds to reduce fall risk during single limb stance    Status On-going   PT LONG TERM GOAL #3   Title Patient will be independent with home exercise program for decreased fall risk and improve safety with functional activities    Status achieved   PT LONG TERM GOAL #4   Title Patient will be able to reach > 12 inches without loss of balance to reduce fall risk with functional activities   Status On-going        Remaining deficits: Unknown due to patient not able to return for therapy. Patient called to cancel  The remaining PT visits due to depression.    Education / Equipment: HEP and fall prevention guidelines instructed  Plan: Patient agrees to discharge.  Patient goals were partially met. Patient is being discharged due to the patient's request.  ?????      Alanson Puls 02/03/2015, 9:08 AM  [place] MAIN [facility] SERVICES [address] [place], [place], [address] Phone: [phone]   Fax:  [phone]

## 2015-02-04 ENCOUNTER — Ambulatory Visit: Payer: Medicare Other | Admitting: Physical Therapy

## 2015-02-05 ENCOUNTER — Ambulatory Visit: Payer: Medicare Other | Admitting: Physical Therapy

## 2015-02-06 ENCOUNTER — Ambulatory Visit: Payer: Medicare Other | Admitting: Physical Therapy

## 2015-02-10 ENCOUNTER — Ambulatory Visit: Payer: Self-pay | Admitting: Physical Therapy

## 2015-02-11 ENCOUNTER — Ambulatory Visit: Payer: Self-pay | Admitting: Physical Therapy

## 2015-02-12 ENCOUNTER — Ambulatory Visit: Payer: Medicare Other | Admitting: Physical Therapy

## 2015-02-21 ENCOUNTER — Other Ambulatory Visit: Payer: Self-pay | Admitting: Orthopedic Surgery

## 2015-02-21 DIAGNOSIS — M5416 Radiculopathy, lumbar region: Secondary | ICD-10-CM

## 2015-02-21 DIAGNOSIS — M5136 Other intervertebral disc degeneration, lumbar region: Secondary | ICD-10-CM

## 2015-02-27 ENCOUNTER — Ambulatory Visit
Admission: RE | Admit: 2015-02-27 | Discharge: 2015-02-27 | Disposition: A | Discharge status: Home / Self Care | Payer: Medicare Other | Source: Ambulatory Visit | Attending: Orthopedic Surgery | Admitting: Orthopedic Surgery

## 2015-02-27 DIAGNOSIS — M5136 Other intervertebral disc degeneration, lumbar region: Secondary | ICD-10-CM | POA: Insufficient documentation

## 2015-02-27 DIAGNOSIS — M5416 Radiculopathy, lumbar region: Secondary | ICD-10-CM | POA: Insufficient documentation

## 2015-03-26 DIAGNOSIS — M5116 Intervertebral disc disorders with radiculopathy, lumbar region: Secondary | ICD-10-CM | POA: Insufficient documentation

## 2015-12-15 DIAGNOSIS — G20 Parkinson's disease: Secondary | ICD-10-CM | POA: Insufficient documentation

## 2016-01-02 ENCOUNTER — Ambulatory Visit (INDEPENDENT_AMBULATORY_CARE_PROVIDER_SITE_OTHER): Payer: Medicare Other | Admitting: Family Medicine

## 2016-01-02 VITALS — BP 136/80 | HR 74 | Temp 98.4°F | Resp 18 | Wt 209.0 lb

## 2016-01-02 DIAGNOSIS — L858 Other specified epidermal thickening: Secondary | ICD-10-CM | POA: Diagnosis not present

## 2016-01-02 NOTE — Progress Notes (Signed)
Subjective:    Patient ID: [name], male    DOB: [birth year], 70 y.o.   MRN: [id number]  HPI  Patient is a and of mine that I know in my entire life. He normally sees Dr. Judithann Sheen.  There has been a lesion growing on his left posterior calf now rapidly for a few months. He would like that lesion excised. His regular physician recommended a dermatology consultation. The patient has severe Parkinson's disease and was wondering if I could remove it just for his convenience. The lesion is approximately 1.2 cm in diameter. 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 It is located on his left lower calf. Past Medical History  Diagnosis Date  . Depression   . Heart disease   . Arthritis     back  . Angina pectoris    Past Surgical History  Procedure Laterality Date  . Appendectomy    . Tonsillectomy    . Eye surgery      cataracts  . Knee arthroscopy Bilateral   . Shoulder surgery Right     benign mass removed  . Cardiac surgery      stents on 12/13/02; 09/09/08; 12/16/10   Current Outpatient Prescriptions on File Prior to Visit  Medication Sig Dispense Refill  . amantadine (SYMMETREL) 100 MG capsule Take 100 mg by mouth 2 (two) times daily.    Marland Kitchen aspirin 81 MG tablet Take 81 mg by mouth daily.    Marland Kitchen buPROPion (WELLBUTRIN) 75 MG tablet Take 75 mg by mouth once. 9am    . carbidopa-levodopa (SINEMET IR) 25-100 MG per tablet Take by mouth 3 (three) times daily. 1 1/2@9am ; 1 1/2@11am , 2pm, 4pm; 1 @@5 :30pm    . Carbidopa-Levodopa (SINEMET PO) Take by mouth. ER 25/100 at 11pm    . clopidogrel (PLAVIX) 75 MG tablet Take 75 mg by mouth daily. 12pm    . donepezil (ARICEPT) 5 MG tablet Take 10 mg by mouth at bedtime.     Marland Kitchen glycopyrrolate (ROBINUL) 1 MG tablet Take 1 mg by mouth 2 (two) times daily. 1/2 , 1/2@11pm     . omeprazole (PRILOSEC) 40 MG capsule Take 40 mg by mouth daily. 11pm    . rOPINIRole (REQUIP) 1 MG tablet Take 1  mg by mouth 2 (two) times daily. 1 @@9am , 2pm    . rosuvastatin (CRESTOR) 5 MG tablet Take 5 mg by mouth at bedtime.    . vitamin B-12 (CYANOCOBALAMIN) 1000 MCG tablet Take 1,000 mcg by mouth daily.     No current facility-administered medications on file prior to visit.   Not on File Social History   Social History  . Marital Status: Married    Spouse Name: N/A  . Number of Children: N/A  . Years of Education: N/A   Occupational History  . Not on file.   Social History Main Topics  . Smoking status: Former Smoker -- 1.50 packs/day for 25 years    Types: Cigarettes, Cigars    Quit date: 09/29/1985  . Smokeless tobacco: Former Neurosurgeon    Quit date: 09/29/1985  . Alcohol Use: 4.2 oz/week    7 Glasses of wine per week  . Drug Use: Not on file  . Sexual Activity: Not on file   Other Topics Concern  . Not on file   Social History Narrative      Review of Systems  All other systems reviewed and are negative.      Objective:  Physical Exam  Cardiovascular: Normal rate and regular rhythm.   Pulmonary/Chest: Effort normal and breath sounds normal.  Vitals reviewed.         Assessment & Plan:  Keratoacanthoma - Plan: Pathology Lesion was anesthetized 0.1% lidocaine with epinephrine. He was then prepped and draped in sterile fashion. A 1.5 cm elliptical excision down to subcutaneous fat was made around the lesion. Skin edges were then approximated with 3 simple interrupted 4-0 Ethilon sutures. The lesion was sent to pathology and labeled container. Stitches out in 7 days.

## 2016-01-06 LAB — PATHOLOGY

## 2016-02-11 ENCOUNTER — Encounter: Payer: Self-pay | Admitting: Family Medicine

## 2016-02-11 DIAGNOSIS — E785 Hyperlipidemia, unspecified: Secondary | ICD-10-CM | POA: Insufficient documentation

## 2016-02-11 DIAGNOSIS — I2510 Atherosclerotic heart disease of native coronary artery without angina pectoris: Secondary | ICD-10-CM | POA: Insufficient documentation

## 2016-02-11 DIAGNOSIS — G8929 Other chronic pain: Secondary | ICD-10-CM | POA: Insufficient documentation

## 2016-02-11 DIAGNOSIS — R5383 Other fatigue: Secondary | ICD-10-CM | POA: Insufficient documentation

## 2016-02-11 DIAGNOSIS — M549 Dorsalgia, unspecified: Secondary | ICD-10-CM

## 2016-02-11 DIAGNOSIS — M1990 Unspecified osteoarthritis, unspecified site: Secondary | ICD-10-CM | POA: Insufficient documentation

## 2016-02-11 DIAGNOSIS — K219 Gastro-esophageal reflux disease without esophagitis: Secondary | ICD-10-CM | POA: Insufficient documentation

## 2016-02-11 DIAGNOSIS — R110 Nausea: Secondary | ICD-10-CM | POA: Insufficient documentation

## 2016-02-11 DIAGNOSIS — I10 Essential (primary) hypertension: Secondary | ICD-10-CM | POA: Insufficient documentation

## 2016-02-11 DIAGNOSIS — I739 Peripheral vascular disease, unspecified: Secondary | ICD-10-CM | POA: Insufficient documentation

## 2016-02-12 ENCOUNTER — Ambulatory Visit: Payer: Medicare Other | Admitting: Family Medicine

## 2016-02-13 ENCOUNTER — Encounter: Payer: Self-pay | Admitting: Emergency Medicine

## 2016-02-13 ENCOUNTER — Emergency Department: Payer: Medicare Other

## 2016-02-13 ENCOUNTER — Emergency Department
Admission: EM | Admit: 2016-02-13 | Discharge: 2016-02-13 | Disposition: A | Discharge status: Home / Self Care | Payer: Medicare Other | Attending: Emergency Medicine | Admitting: Emergency Medicine

## 2016-02-13 DIAGNOSIS — F329 Major depressive disorder, single episode, unspecified: Secondary | ICD-10-CM | POA: Diagnosis not present

## 2016-02-13 DIAGNOSIS — S299XXA Unspecified injury of thorax, initial encounter: Secondary | ICD-10-CM | POA: Diagnosis present

## 2016-02-13 DIAGNOSIS — Y999 Unspecified external cause status: Secondary | ICD-10-CM | POA: Diagnosis not present

## 2016-02-13 DIAGNOSIS — G20 Parkinson's disease: Secondary | ICD-10-CM | POA: Insufficient documentation

## 2016-02-13 DIAGNOSIS — Y939 Activity, unspecified: Secondary | ICD-10-CM | POA: Diagnosis not present

## 2016-02-13 DIAGNOSIS — I2510 Atherosclerotic heart disease of native coronary artery without angina pectoris: Secondary | ICD-10-CM | POA: Insufficient documentation

## 2016-02-13 DIAGNOSIS — M1990 Unspecified osteoarthritis, unspecified site: Secondary | ICD-10-CM | POA: Insufficient documentation

## 2016-02-13 DIAGNOSIS — S20212A Contusion of left front wall of thorax, initial encounter: Secondary | ICD-10-CM | POA: Diagnosis not present

## 2016-02-13 DIAGNOSIS — T148XXA Other injury of unspecified body region, initial encounter: Secondary | ICD-10-CM

## 2016-02-13 DIAGNOSIS — Z7982 Long term (current) use of aspirin: Secondary | ICD-10-CM | POA: Insufficient documentation

## 2016-02-13 DIAGNOSIS — Z79899 Other long term (current) drug therapy: Secondary | ICD-10-CM | POA: Insufficient documentation

## 2016-02-13 DIAGNOSIS — R1012 Left upper quadrant pain: Secondary | ICD-10-CM | POA: Insufficient documentation

## 2016-02-13 DIAGNOSIS — I252 Old myocardial infarction: Secondary | ICD-10-CM | POA: Diagnosis not present

## 2016-02-13 DIAGNOSIS — E785 Hyperlipidemia, unspecified: Secondary | ICD-10-CM | POA: Insufficient documentation

## 2016-02-13 DIAGNOSIS — Y929 Unspecified place or not applicable: Secondary | ICD-10-CM | POA: Insufficient documentation

## 2016-02-13 DIAGNOSIS — Z87891 Personal history of nicotine dependence: Secondary | ICD-10-CM | POA: Diagnosis not present

## 2016-02-13 DIAGNOSIS — W1800XA Striking against unspecified object with subsequent fall, initial encounter: Secondary | ICD-10-CM | POA: Insufficient documentation

## 2016-02-13 DIAGNOSIS — I739 Peripheral vascular disease, unspecified: Secondary | ICD-10-CM | POA: Diagnosis not present

## 2016-02-13 HISTORY — DX: Parkinson's disease: G20

## 2016-02-13 LAB — COMPREHENSIVE METABOLIC PANEL
ALT: <5 U/L — ABNORMAL LOW (ref 17–63)
ANION GAP: 6 (ref 5–15)
AST: 14 U/L — ABNORMAL LOW (ref 15–41)
Albumin: 3.8 g/dL (ref 3.5–5.0)
Alkaline Phosphatase: 73 U/L (ref 38–126)
BUN: 21 mg/dL — ABNORMAL HIGH (ref 6–20)
CHLORIDE: 108 mmol/L (ref 101–111)
CO2: 25 mmol/L (ref 22–32)
CREATININE: 0.87 mg/dL (ref 0.61–1.24)
Calcium: 9.4 mg/dL (ref 8.9–10.3)
Glucose, Bld: 109 mg/dL — ABNORMAL HIGH (ref 65–99)
POTASSIUM: 4 mmol/L (ref 3.5–5.1)
SODIUM: 139 mmol/L (ref 135–145)
Total Bilirubin: 0.7 mg/dL (ref 0.3–1.2)
Total Protein: 6.7 g/dL (ref 6.5–8.1)

## 2016-02-13 LAB — CBC WITH DIFFERENTIAL/PLATELET
BASOS ABS: 0.1 10*3/uL (ref 0–0.1)
Basophils Relative: 1 %
EOS ABS: 0.2 10*3/uL (ref 0–0.7)
Eosinophils Relative: 3 %
HCT: 37.9 % — ABNORMAL LOW (ref 40.0–52.0)
HEMOGLOBIN: 12.7 g/dL — AB (ref 13.0–18.0)
LYMPHS ABS: 1.5 10*3/uL (ref 1.0–3.6)
Lymphocytes Relative: 21 %
MCH: 30.4 pg (ref 26.0–34.0)
MCHC: 33.6 g/dL (ref 32.0–36.0)
MCV: 90.3 fL (ref 80.0–100.0)
Monocytes Absolute: 0.6 10*3/uL (ref 0.2–1.0)
Monocytes Relative: 8 %
NEUTROS PCT: 67 %
Neutro Abs: 4.8 10*3/uL (ref 1.4–6.5)
PLATELETS: 197 10*3/uL (ref 150–440)
RBC: 4.19 MIL/uL — AB (ref 4.40–5.90)
RDW: 13.5 % (ref 11.5–14.5)
WBC: 7.1 10*3/uL (ref 3.8–10.6)

## 2016-02-13 LAB — PROTIME-INR
INR: 1.01
PROTHROMBIN TIME: 13.5 s (ref 11.4–15.0)

## 2016-02-13 MED ORDER — IOPAMIDOL (ISOVUE-370) INJECTION 76%
100.0000 mL | Freq: Once | INTRAVENOUS | Status: AC | PRN
  Administered 2016-02-13: 100 mL via INTRAVENOUS

## 2016-02-13 NOTE — ED Notes (Signed)
Pt on Plavix daily. Large bruise to left rib cage / flank area.

## 2016-02-13 NOTE — ED Notes (Signed)
Pt presents to ED with worsening rib pain to the left side of his ribs. Pt fell last Saturday landing on a chair with the left side of his rib cage. Pt seen by dr sparks Tuesday with negative xrays. Pt states last night his pain increased. Bruising noted.

## 2016-02-13 NOTE — ED Notes (Signed)
Patient transported to X-ray 

## 2016-02-13 NOTE — ED Provider Notes (Signed)
[facility] Emergency Department Provider Note   ____________________________________________  Time seen: Approximately 7:22 AM  I have reviewed the triage vital signs and the nursing notes.   HISTORY  Chief Complaint Rib Injury    HPI [name] is a 70 y.o. male who fell last Saturday and hit his left-sided ribs on chair. She complains of increasing pain in the left lower chest wall with a lot of bruising in the left chest and abdomen. Patient takes Plavix every day. The patient reports pain is always there but at times it is very severe and catches him. When does that its sharp in nature. Pain has been there since the fall but again it's getting worse.   Past Medical History  Diagnosis Date  . Depression   . Heart disease   . Arthritis     back  . Angina pectoris (HCC)   . Parkinson disease [facility])     Patient Active Problem List   Diagnosis Date Noted  . Arteriosclerosis of coronary artery 02/11/2016  . Back pain, chronic 02/11/2016  . Feeling bilious 02/11/2016  . Fatigue 02/11/2016  . Acid reflux 02/11/2016  . HLD (hyperlipidemia) 02/11/2016  . BP (high blood pressure) 02/11/2016  . Arthritis, degenerative 02/11/2016  . Peripheral vascular disease (HCC) 02/11/2016  . Parkinson's disease (HCC) 12/15/2015  . Neuritis or radiculitis due to rupture of lumbar intervertebral disc 03/26/2015  . Absolute anemia 04/18/2014  . Hallucination 06/12/2013  . CAD, multiple vessel 10/30/2012  . Cataract 10/30/2012  . Myocardial infarction [facility]) 11/26/2002    Past Surgical History  Procedure Laterality Date  . Appendectomy    . Tonsillectomy    . Eye surgery      cataracts  . Knee arthroscopy Bilateral   . Shoulder surgery Right     benign mass removed  . Cardiac surgery      stents on 12/13/02; 09/09/08; 12/16/10    Current Outpatient Rx  Name  Route  Sig  Dispense  Refill  . amantadine (SYMMETREL) 50 MG/5ML solution   Oral   Take 100  mg by mouth 2 (two) times daily. 10ml @@ 9am, 11pm         . aspirin 81 MG tablet   Oral   Take 81 mg by mouth daily.         Marland Kitchen buPROPion (WELLBUTRIN) 75 MG tablet   Oral   Take 75 mg by mouth once. 9am         . carbidopa-levodopa (SINEMET IR) 25-100 MG per tablet   Oral   Take 1-1.5 tablets by mouth 5 (five) times daily. 1 1/2@9am ; 1 1/2@11am , 2pm, 4pm; 1 :30pm         . Carbidopa-Levodopa (SINEMET PO)   Oral   Take by mouth. ER 25/100 at 11pm         . clopidogrel (PLAVIX) 75 MG tablet   Oral   Take 75 mg by mouth daily. 12pm         . donepezil (ARICEPT) 5 MG tablet   Oral   Take 10 mg by mouth at bedtime.          Marland Kitchen glycopyrrolate (ROBINUL) 1 MG tablet   Oral   Take 0.5-1 mg by mouth 2 (two) times daily. 1 , 1/2@11pm          . nitroGLYCERIN (NITROSTAT) 0.4 MG SL tablet   Sublingual   Place 0.4 mg under the tongue every 5 (five) minutes as needed  for chest pain.         Marland Kitchen omeprazole (PRILOSEC) 40 MG capsule   Oral   Take 40 mg by mouth daily. 11pm         . QUEtiapine (SEROQUEL) 50 MG tablet   Oral   Take 25 mg by mouth at bedtime.         Marland Kitchen rOPINIRole (REQUIP) 1 MG tablet   Oral   Take 1 mg by mouth 2 (two) times daily. 1 @9am , 2pm         . rosuvastatin (CRESTOR) 5 MG tablet   Oral   Take 5 mg by mouth at bedtime.         . vitamin B-12 (CYANOCOBALAMIN) 1000 MCG tablet   Oral   Take 1,000 mcg by mouth daily.           Allergies Review of patient's allergies indicates no known allergies.  Family History  Problem Relation Age of Onset  . Heart disease Father     Social History Social History  Substance Use Topics  . Smoking status: Former Smoker -- 1.50 packs/day for 25 years    Types: Cigarettes, Cigars    Quit date: 09/29/1985  . Smokeless tobacco: Former Neurosurgeon    Quit date: 09/29/1985  . Alcohol Use: 4.2 oz/week    7 Glasses of wine per week    Review of Systems onstitutional: No fever/chills Eyes:  No visual changes. ENT: No sore throat. Cardiovascular:  chest pain      in area of bruising. Respiratory: Denies shortness of breath. Gastrointestinal: No abdominal pain.  No nausea, no vomiting.  No diarrhea.  No constipation. Genitourinary: Negative for dysuria. Musculoskeletal: Negative for back pain. Skin: Negative for rash. Neurological: Negative for headaches, focal weakness or numbness.  10-point ROS otherwise negative.  ____________________________________________   PHYSICAL EXAM:  VITAL SIGNS: ED Triage Vitals  Enc Vitals Group     BP 02/13/16 0615 114/78 mmHg     Pulse Rate 02/13/16 0615 71     Resp 02/13/16 0615 18     Temp 02/13/16 0615 97.8 F (36.6 C)     Temp Source 02/13/16 0615 Oral     SpO2 02/13/16 0615 97 %     Weight 02/13/16 0615 204 lb (92.534 kg)     Height 02/13/16 0615 5\' 10"  (1.778 m)     Head Cir --      Peak Flow --      Pain Score 02/13/16 0616 9     Pain Loc --      Pain Edu? --      Excl. in GC? --    Constitutional: Alert and oriented. Well appearing and in no acute distress. Eyes: Conjunctivae are normal. PERRL. EOMI. Head: Atraumatic. Nose: No congestion/rhinnorhea. Mouth/Throat: Mucous membranes are moist.  Oropharynx non-erythematous. Neck: No stridor. Cardiovascular: Normal rate, regular rhythm. Grossly normal heart sounds.  Good peripheral circulation. Respiratory: Normal respiratory effort.  No retractions. Lungs CTAB. Gastrointestinal: Soft and nontender. No distention. No abdominal bruits. No CVA tenderness. Musculoskeletal: No lower extremity tenderness nor edema.  No joint effusions.Patient has a large bruise about 1 foot wide by 6 inches long at the bottom of the left ribs and centered over the midaxillary line. Neurologic:  Normal speech and language. No gross focal neurologic deficits are appreciated. No gait instability. Skin:  Skin is warm, dry and intact. No rash noted. Psychiatric: Mood and affect are normal. Speech  and behavior are normal.  ____________________________________________  LABS (all labs ordered are listed, but only abnormal results are displayed)  Labs Reviewed  CBC WITH DIFFERENTIAL/PLATELET - Abnormal; Notable for the following:    RBC 4.19 (*)    Hemoglobin 12.7 (*)    HCT 37.9 (*)    All other components within normal limits  COMPREHENSIVE METABOLIC PANEL - Abnormal; Notable for the following:    Glucose, Bld 109 (*)    BUN 21 (*)    AST 14 (*)    ALT <5 (*)    All other components within normal limits  PROTIME-INR  URINALYSIS COMPLETEWITH MICROSCOPIC ([facility] ONLY)   ____________________________________________  [facility] as the pain is in the lower left ribs and upper left abdomen on the side is no chest pain at all. __________________________________  RADIOLOGY  IMPRESSION: CT chest: Areas of consolidation in portions of the left lower lobe and lingula. Question pulmonary contusion versus pneumonia. Both entities may exist concurrently. No pneumothorax. Scattered small nodular opacities remain stable compared to prior study.  No mediastinal hematoma. No aortic lesion identified beyond atherosclerotic calcification. No pericardial thickening or effusion. No adenopathy.  Soft tissue edema laterally without fracture evident.  CT abdomen and pelvis: Soft tissue edema laterally along the left upper abdominal and pelvic wall without well-defined hematoma. Superior endplate concavity at L3, nonacute appearing. No acute appearing fracture evident.  No visceral laceration or rupture seen. No inflammatory foci identified. Atherosclerotic calcification noted in aorta and major pelvic arterial structures. No contrast extravasation. No inflammatory foci evident.  No renal or ureteral calculi. No hydronephrosis. No bowel obstruction. No abscess.   Electronically Signed  By: Bretta BangWilliam Woodruff III M.D.  On: 02/13/2016  08:14 ____________________________________________   PROCEDURES    ____________________________________________   INITIAL IMPRESSION / ASSESSMENT AND PLAN / ED COURSE  Pertinent labs & imaging results that were available during my care of the patient were reviewed by me and considered in my medical decision making (see chart for details). Suggest in detail with Dr. Judithann SheenSparks. He will follow-up in the office. He he doesn't want to use anything stronger than Tylenol this point I agree as the patient seems a little unsteady. ____________________________________________   FINAL CLINICAL IMPRESSION(S) / ED DIAGNOSES  Final diagnoses:  Contusion      NEW MEDICATIONS STARTED DURING THIS VISIT:  New Prescriptions   No medications on file     Note:  This document was prepared using Dragon voice recognition software and may include unintentional dictation errors.    Arnaldo NatalPaul F Belva Koziel, MD 02/13/16 [id number]

## 2016-02-18 ENCOUNTER — Encounter: Payer: Self-pay | Admitting: Internal Medicine

## 2016-07-22 ENCOUNTER — Other Ambulatory Visit: Payer: Self-pay | Admitting: Neurology

## 2016-07-22 DIAGNOSIS — F0281 Dementia in other diseases classified elsewhere with behavioral disturbance: Secondary | ICD-10-CM

## 2016-07-22 DIAGNOSIS — G20 Parkinson's disease: Principal | ICD-10-CM

## 2016-07-22 DIAGNOSIS — F02818 Dementia in other diseases classified elsewhere, unspecified severity, with other behavioral disturbance: Secondary | ICD-10-CM

## 2016-07-26 ENCOUNTER — Ambulatory Visit
Admission: RE | Admit: 2016-07-26 | Discharge: 2016-07-26 | Disposition: A | Discharge status: Home / Self Care | Payer: Medicare Other | Source: Ambulatory Visit | Attending: Neurology | Admitting: Neurology

## 2016-07-26 DIAGNOSIS — F0390 Unspecified dementia without behavioral disturbance: Secondary | ICD-10-CM | POA: Diagnosis present

## 2016-07-26 DIAGNOSIS — G20 Parkinson's disease: Secondary | ICD-10-CM | POA: Diagnosis not present

## 2016-07-26 DIAGNOSIS — F0281 Dementia in other diseases classified elsewhere with behavioral disturbance: Secondary | ICD-10-CM

## 2016-09-29 ENCOUNTER — Inpatient Hospital Stay
Admission: EM | Admit: 2016-09-29 | Discharge: 2016-10-02 | DRG: 194 | Disposition: A | Discharge status: Skilled Nursing Facility | Payer: PPO | Attending: Internal Medicine | Admitting: Internal Medicine

## 2016-09-29 ENCOUNTER — Emergency Department: Payer: PPO

## 2016-09-29 DIAGNOSIS — S0990XA Unspecified injury of head, initial encounter: Secondary | ICD-10-CM | POA: Diagnosis not present

## 2016-09-29 DIAGNOSIS — W1800XA Striking against unspecified object with subsequent fall, initial encounter: Secondary | ICD-10-CM | POA: Diagnosis not present

## 2016-09-29 DIAGNOSIS — E440 Moderate protein-calorie malnutrition: Secondary | ICD-10-CM | POA: Diagnosis present

## 2016-09-29 DIAGNOSIS — Z9181 History of falling: Secondary | ICD-10-CM | POA: Diagnosis not present

## 2016-09-29 DIAGNOSIS — F329 Major depressive disorder, single episode, unspecified: Secondary | ICD-10-CM | POA: Diagnosis not present

## 2016-09-29 DIAGNOSIS — Z951 Presence of aortocoronary bypass graft: Secondary | ICD-10-CM

## 2016-09-29 DIAGNOSIS — I2510 Atherosclerotic heart disease of native coronary artery without angina pectoris: Secondary | ICD-10-CM | POA: Diagnosis present

## 2016-09-29 DIAGNOSIS — K219 Gastro-esophageal reflux disease without esophagitis: Secondary | ICD-10-CM | POA: Diagnosis present

## 2016-09-29 DIAGNOSIS — I25119 Atherosclerotic heart disease of native coronary artery with unspecified angina pectoris: Secondary | ICD-10-CM | POA: Diagnosis not present

## 2016-09-29 DIAGNOSIS — S5002XA Contusion of left elbow, initial encounter: Secondary | ICD-10-CM | POA: Diagnosis not present

## 2016-09-29 DIAGNOSIS — S299XXA Unspecified injury of thorax, initial encounter: Secondary | ICD-10-CM | POA: Diagnosis not present

## 2016-09-29 DIAGNOSIS — J181 Lobar pneumonia, unspecified organism: Secondary | ICD-10-CM

## 2016-09-29 DIAGNOSIS — Z87891 Personal history of nicotine dependence: Secondary | ICD-10-CM

## 2016-09-29 DIAGNOSIS — I10 Essential (primary) hypertension: Secondary | ICD-10-CM | POA: Diagnosis present

## 2016-09-29 DIAGNOSIS — R443 Hallucinations, unspecified: Secondary | ICD-10-CM | POA: Diagnosis not present

## 2016-09-29 DIAGNOSIS — E785 Hyperlipidemia, unspecified: Secondary | ICD-10-CM | POA: Diagnosis present

## 2016-09-29 DIAGNOSIS — J189 Pneumonia, unspecified organism: Secondary | ICD-10-CM | POA: Diagnosis present

## 2016-09-29 DIAGNOSIS — F0281 Dementia in other diseases classified elsewhere with behavioral disturbance: Secondary | ICD-10-CM | POA: Diagnosis not present

## 2016-09-29 DIAGNOSIS — I248 Other forms of acute ischemic heart disease: Secondary | ICD-10-CM | POA: Diagnosis present

## 2016-09-29 DIAGNOSIS — I252 Old myocardial infarction: Secondary | ICD-10-CM | POA: Diagnosis not present

## 2016-09-29 DIAGNOSIS — Y999 Unspecified external cause status: Secondary | ICD-10-CM | POA: Diagnosis not present

## 2016-09-29 DIAGNOSIS — D649 Anemia, unspecified: Secondary | ICD-10-CM | POA: Diagnosis not present

## 2016-09-29 DIAGNOSIS — I739 Peripheral vascular disease, unspecified: Secondary | ICD-10-CM | POA: Diagnosis not present

## 2016-09-29 DIAGNOSIS — Y939 Activity, unspecified: Secondary | ICD-10-CM | POA: Diagnosis not present

## 2016-09-29 DIAGNOSIS — W19XXXA Unspecified fall, initial encounter: Secondary | ICD-10-CM | POA: Diagnosis not present

## 2016-09-29 DIAGNOSIS — Z7902 Long term (current) use of antithrombotics/antiplatelets: Secondary | ICD-10-CM

## 2016-09-29 DIAGNOSIS — M6281 Muscle weakness (generalized): Secondary | ICD-10-CM | POA: Diagnosis not present

## 2016-09-29 DIAGNOSIS — S59902A Unspecified injury of left elbow, initial encounter: Secondary | ICD-10-CM | POA: Diagnosis not present

## 2016-09-29 DIAGNOSIS — Z7982 Long term (current) use of aspirin: Secondary | ICD-10-CM | POA: Diagnosis not present

## 2016-09-29 DIAGNOSIS — S199XXA Unspecified injury of neck, initial encounter: Secondary | ICD-10-CM | POA: Diagnosis not present

## 2016-09-29 DIAGNOSIS — Z8249 Family history of ischemic heart disease and other diseases of the circulatory system: Secondary | ICD-10-CM

## 2016-09-29 DIAGNOSIS — S50812A Abrasion of left forearm, initial encounter: Secondary | ICD-10-CM | POA: Diagnosis not present

## 2016-09-29 DIAGNOSIS — Z7409 Other reduced mobility: Secondary | ICD-10-CM | POA: Diagnosis not present

## 2016-09-29 DIAGNOSIS — Z79899 Other long term (current) drug therapy: Secondary | ICD-10-CM | POA: Diagnosis not present

## 2016-09-29 DIAGNOSIS — R41841 Cognitive communication deficit: Secondary | ICD-10-CM | POA: Diagnosis not present

## 2016-09-29 DIAGNOSIS — N3281 Overactive bladder: Secondary | ICD-10-CM | POA: Diagnosis not present

## 2016-09-29 DIAGNOSIS — G20 Parkinson's disease: Secondary | ICD-10-CM | POA: Diagnosis not present

## 2016-09-29 DIAGNOSIS — R410 Disorientation, unspecified: Secondary | ICD-10-CM

## 2016-09-29 DIAGNOSIS — R2681 Unsteadiness on feet: Secondary | ICD-10-CM

## 2016-09-29 DIAGNOSIS — M542 Cervicalgia: Secondary | ICD-10-CM | POA: Diagnosis not present

## 2016-09-29 DIAGNOSIS — R262 Difficulty in walking, not elsewhere classified: Secondary | ICD-10-CM

## 2016-09-29 DIAGNOSIS — M25552 Pain in left hip: Secondary | ICD-10-CM | POA: Diagnosis not present

## 2016-09-29 DIAGNOSIS — Z6825 Body mass index (BMI) 25.0-25.9, adult: Secondary | ICD-10-CM | POA: Diagnosis not present

## 2016-09-29 DIAGNOSIS — Y92239 Unspecified place in hospital as the place of occurrence of the external cause: Secondary | ICD-10-CM | POA: Diagnosis not present

## 2016-09-29 DIAGNOSIS — R079 Chest pain, unspecified: Secondary | ICD-10-CM | POA: Diagnosis not present

## 2016-09-29 DIAGNOSIS — Z23 Encounter for immunization: Secondary | ICD-10-CM | POA: Diagnosis not present

## 2016-09-29 DIAGNOSIS — Z7401 Bed confinement status: Secondary | ICD-10-CM | POA: Diagnosis not present

## 2016-09-29 DIAGNOSIS — M25512 Pain in left shoulder: Secondary | ICD-10-CM | POA: Diagnosis not present

## 2016-09-29 DIAGNOSIS — F0280 Dementia in other diseases classified elsewhere without behavioral disturbance: Secondary | ICD-10-CM | POA: Diagnosis not present

## 2016-09-29 DIAGNOSIS — M25522 Pain in left elbow: Secondary | ICD-10-CM | POA: Diagnosis not present

## 2016-09-29 DIAGNOSIS — R6889 Other general symptoms and signs: Secondary | ICD-10-CM | POA: Diagnosis not present

## 2016-09-29 LAB — URINALYSIS, COMPLETE (UACMP) WITH MICROSCOPIC
BILIRUBIN URINE: NEGATIVE
Bacteria, UA: NONE SEEN
Glucose, UA: NEGATIVE mg/dL
Hgb urine dipstick: NEGATIVE
Ketones, ur: 5 mg/dL — AB
LEUKOCYTES UA: NEGATIVE
Nitrite: NEGATIVE
PH: 5 (ref 5.0–8.0)
Protein, ur: 30 mg/dL — AB
SPECIFIC GRAVITY, URINE: 1.026 (ref 1.005–1.030)
SQUAMOUS EPITHELIAL / LPF: NONE SEEN

## 2016-09-29 LAB — RAPID INFLUENZA A&B ANTIGENS (ARMC ONLY): INFLUENZA B (ARMC): NEGATIVE

## 2016-09-29 LAB — COMPREHENSIVE METABOLIC PANEL
ALK PHOS: 64 U/L (ref 38–126)
AST: 22 U/L (ref 15–41)
Albumin: 4 g/dL (ref 3.5–5.0)
Anion gap: 7 (ref 5–15)
BILIRUBIN TOTAL: 0.7 mg/dL (ref 0.3–1.2)
BUN: 14 mg/dL (ref 6–20)
CALCIUM: 9.4 mg/dL (ref 8.9–10.3)
CO2: 28 mmol/L (ref 22–32)
CREATININE: 0.89 mg/dL (ref 0.61–1.24)
Chloride: 102 mmol/L (ref 101–111)
Glucose, Bld: 128 mg/dL — ABNORMAL HIGH (ref 65–99)
Potassium: 4.3 mmol/L (ref 3.5–5.1)
Sodium: 137 mmol/L (ref 135–145)
TOTAL PROTEIN: 7.4 g/dL (ref 6.5–8.1)

## 2016-09-29 LAB — CBC
HCT: 38 % — ABNORMAL LOW (ref 40.0–52.0)
Hemoglobin: 12.6 g/dL — ABNORMAL LOW (ref 13.0–18.0)
MCH: 31.5 pg (ref 26.0–34.0)
MCHC: 33.2 g/dL (ref 32.0–36.0)
MCV: 94.8 fL (ref 80.0–100.0)
PLATELETS: 219 10*3/uL (ref 150–440)
RBC: 4.01 MIL/uL — AB (ref 4.40–5.90)
RDW: 13.6 % (ref 11.5–14.5)
WBC: 15.9 10*3/uL — AB (ref 3.8–10.6)

## 2016-09-29 LAB — TROPONIN I: TROPONIN I: 0.04 ng/mL — AB (ref <0.03)

## 2016-09-29 LAB — RAPID INFLUENZA A&B ANTIGENS: Influenza A (ARMC): NEGATIVE

## 2016-09-29 MED ORDER — CARBIDOPA-LEVODOPA ER 25-100 MG PO TBCR
1.0000 | EXTENDED_RELEASE_TABLET | Freq: Every day | ORAL | Status: DC
  Administered 2016-09-30 – 2016-10-01 (×3): 1 via ORAL
  Filled 2016-09-29 (×4): qty 1

## 2016-09-29 MED ORDER — DONEPEZIL HCL 5 MG PO TABS
10.0000 mg | ORAL_TABLET | Freq: Every day | ORAL | Status: DC
  Administered 2016-09-30 – 2016-10-01 (×3): 10 mg via ORAL
  Filled 2016-09-29 (×3): qty 2

## 2016-09-29 MED ORDER — QUETIAPINE FUMARATE 25 MG PO TABS
50.0000 mg | ORAL_TABLET | Freq: Every day | ORAL | Status: DC
  Administered 2016-09-30 – 2016-10-01 (×3): 50 mg via ORAL
  Filled 2016-09-29 (×3): qty 2

## 2016-09-29 MED ORDER — LEVOFLOXACIN IN D5W 750 MG/150ML IV SOLN
750.0000 mg | Freq: Once | INTRAVENOUS | Status: AC
  Administered 2016-09-29: 750 mg via INTRAVENOUS
  Filled 2016-09-29: qty 150

## 2016-09-29 MED ORDER — ROSUVASTATIN CALCIUM 10 MG PO TABS
5.0000 mg | ORAL_TABLET | Freq: Every day | ORAL | Status: DC
  Administered 2016-09-30 – 2016-10-01 (×3): 5 mg via ORAL
  Filled 2016-09-29 (×3): qty 1

## 2016-09-29 NOTE — ED Notes (Signed)
Lab reports troponin 0.04; charge nurse notified

## 2016-09-29 NOTE — ED Notes (Addendum)
Reviewed pt's chart and results; troponin added; charge nurse notified

## 2016-09-29 NOTE — ED Triage Notes (Signed)
Pt arrives to ER via POV after fall. Pt hx of Parkinson's, mechanical fall. Wife reports that has not been acting himself today, confusion. Pt reporting hallucinations. Pt alert and oriented X4, active, cooperative, pt in NAD. RR even and unlabored, color WNL.

## 2016-09-29 NOTE — H&P (Signed)
History and Physical   [facility] - [place] @ [facility] Admission History and Physical [employer], D.O.    Patient Name: [name] MR#: [id number] Date of Birth: 02-10-1946 Date of Admission: 09/29/2016  Referring MD/NP/PA: Dr. Lenard Lance Primary Care Physician: Marguarite Arbour, MD Outpatient Specialists: Dr. Sherryll Burger (neuro), Dr. Juliann Pares,  Patient coming from: home  Chief Complaint: Altered mental status  HPI: [name] is a 71 y.o. male with a known history of Parkinson's disease, CAD, depression, GERD, anemia, depression, hyperlipidemia, hypertension, osteoarthritis, peripheral vascular disease presents to the emergency department for evaluation of altered mental status.  Patient was in a usual state of health until 2 days ago when he developed worsening confusion. Patient's wife thought it may be related to an adjustment to his Parkinson's medications which has been tapering down because of confusion however today the patient fell hitting his elbow on a table. Patient's wife indicates that he is occasionally confused secondary to Parkinson's and mild dementia but today's symptoms or dramatically different than his usual. She also reports that he has been suffering from a head cold including nasal and sinus congestion with a dry nonproductive cough. She denies any fever..   Otherwise there has been no change in status. Patient has been taking medication as prescribed and there has been no recent change in medication or diet.  There has been no recent illness, hospitalizations, travel or sick contacts.    On questioning, the patient is unable to answer questions appropriately secondary to altered mental status.  Patient's wife denies fevers/chills, dizziness, chest pain, shortness of breath, N/V/C/D, abdominal pain, dysuria/frequency.  ED Course: Patient received Levaquin and IV fluids  Review of Systems:  CONSTITUTIONAL: No fever/chills, fatigue, weight gain/loss,  headache. EYES: No blurry or double vision. ENT: No tinnitus, postnasal drip, redness or soreness of the oropharynx. Positive nasal congestion RESPIRATORY: No cough, dyspnea, wheeze, hemoptysis.  CARDIOVASCULAR: No chest pain, palpitations, syncope, orthopnea,  GASTROINTESTINAL: No nausea, vomiting, abdominal pain, constipation, diarrhea.  No hematemesis, melena or hematochezia. GENITOURINARY: No dysuria, frequency, hematuria. ENDOCRINE: No polyuria or nocturia. No heat or cold intolerance. HEMATOLOGY: No anemia, bruising, bleeding. INTEGUMENTARY: No rashes, ulcers, lesions. MUSCULOSKELETAL: No arthritis, gout, dyspnea.  NEUROLOGIC: No numbness, tingling, ataxia, seizure-type activity, positive fall, weakness. PSYCHIATRIC: No anxiety, depression, insomnia.   Past Medical History:  Diagnosis Date  . Angina pectoris (HCC)   . Arthritis    back  . Depression   . Heart disease   . Parkinson disease (HCC)   . Acid reflux  . Anemia, unspecified  . CAD (coronary artery disease)  s/p CABG  . Chronic back pain, unspecified  . Chronic nausea  . Depression, unspecified  . Fatigue  weakness, angina  . GERD (gastroesophageal reflux disease)  . Hyperlipidemia, unspecified  . Hypertension  . MI (myocardial infarction) March 2004  s/p stent placement  . Osteoarthritis  . Parkinson's disease (CMS-HCC)  . PVD (peripheral vascular disease) (CMS-HCC)  . PD (Parkinson's disease) (CMS-HCC)  . CAD, multiple vessel  . Cataract  . Hallucinations  . CAD (coronary artery disease)  . MI (myocardial infarction)  . Hyperlipidemia, unspecified  . Osteoarthritis  . GERD (gastroesophageal reflux disease)  . PVD (peripheral vascular disease) (CMS-HCC)  . Chronic nausea  . Chronic back pain, unspecified  . Fatigue  . Anemia, unspecified  . Hypertension  . Lumbar radiculitis  . Dementia due to Parkinson's disease with behavioral disturbance (CMS-HCC)  . 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 APPENDECTOMY    . CARDIAC SURGERY     stents on 12/13/02; 09/09/08; 12/16/10  . EYE SURGERY     cataracts  . KNEE ARTHROSCOPY Bilateral   . SHOULDER SURGERY Right    benign mass removed  . TONSILLECTOMY       reports that he quit smoking about 31 years ago. His smoking use included Cigarettes and Cigars. He has a 37.50 pack-year smoking history. He quit smokeless tobacco use about 31 years ago. He reports that he drinks about 4.2 oz of alcohol per week . His drug history is not on file.  No Known Allergies  Family History  Problem Relation Age of Onset  . Heart disease Father    Family history has been reviewed and confirmed with patient.   Prior to Admission medications   Medication Sig Start Date End Date Taking? Authorizing Provider  aspirin 81 MG tablet Take 81 mg by mouth daily.   Yes Historical Provider, MD  buPROPion (WELLBUTRIN) 75 MG tablet Take 75 mg by mouth once. 9am   Yes Historical Provider, MD  carbidopa-levodopa (SINEMET IR) 25-100 MG per tablet Take 1-1.5 tablets by mouth 5 (five) times daily. 1 1/2@9am ; 1 1/2@11am , 2pm, 4pm; 1 @5 :30pm   Yes Historical Provider, MD  Carbidopa-Levodopa ER (SINEMET CR) 25-100 MG tablet controlled release Take 1 tablet by mouth at bedtime.   Yes Historical Provider, MD  clopidogrel (PLAVIX) 75 MG tablet Take 75 mg by mouth daily. 12pm   Yes Historical Provider, MD  donepezil (ARICEPT) 10 MG tablet Take 10 mg by mouth at bedtime.   Yes Historical Provider, MD  midodrine (PROAMATINE) 2.5 MG tablet Take 2.5 mg by mouth 3 (three) times daily as needed (blood pressure below 100/80).   Yes Historical Provider, MD  nitroGLYCERIN (NITROSTAT) 0.4 MG SL tablet Place 0.4 mg under the tongue every 5 (five) minutes as needed for chest pain.   Yes Historical Provider, MD  omeprazole (PRILOSEC) 40 MG capsule Take 40 mg by mouth daily. 11pm   Yes Historical Provider, MD  oxybutynin  (DITROPAN) 5 MG tablet Take 5 mg by mouth 3 (three) times daily.   Yes Historical Provider, MD  Pimavanserin Tartrate 17 MG TABS Take 34 mg by mouth daily.   Yes Historical Provider, MD  QUEtiapine (SEROQUEL) 50 MG tablet Take 100 mg by mouth at bedtime.    Yes Historical Provider, MD  rOPINIRole (REQUIP) 1 MG tablet Take 1 mg by mouth 2 (two) times daily. 1 @9am , 2pm   Yes Historical Provider, MD  rosuvastatin (CRESTOR) 5 MG tablet Take 5 mg by mouth at bedtime.   Yes Historical Provider, MD  vitamin B-12 (CYANOCOBALAMIN) 1000 MCG tablet Take 1,000 mcg by mouth daily.   Yes Historical Provider, MD  amantadine (SYMMETREL) 50 MG/5ML solution Take 100 mg by mouth 2 (two) times daily. 10ml @@ 9am, 11pm    Historical Provider, MD    Physical Exam: Vitals:   09/29/16 2115 09/29/16 2145 09/29/16 2200 09/29/16 2230  BP: (!) 143/103 (!) 158/78 (!) 156/89 111/90  Pulse: (!) 102 99 98 (!) 102  Resp:      Temp:      TempSrc:      SpO2: 96% 98% 97% 95%  Weight:      Height:        GENERAL: 71 y.o.-year-old White male patient, well-developed, well-nourished lying in the bed in no acute distress.  Pleasantly confused. Mild  psychomotor agitation including tremor ( not baseline) HEENT: Head atraumatic, normocephalic. Pupils equal, round, reactive to light and accommodation. No scleral icterus. Extraocular muscles intact. Nares are patent. Oropharynx is clear. Mucus membranes moist. NECK: Supple, full range of motion. No JVD, no bruit heard. No thyroid enlargement, no tenderness, no cervical lymphadenopathy. CHEST: Normal breath sounds bilaterally. No wheezing, rales, rhonchi or crackles. No use of accessory muscles of respiration.  No reproducible chest wall tenderness.  CARDIOVASCULAR: S1, S2 normal. No murmurs, rubs, or gallops. Cap refill <2 seconds. Pulses intact distally.  ABDOMEN: Soft, nondistended, nontender, . No rebound, guarding, rigidity. Normoactive bowel sounds present in all four quadrants.  No organomegaly or mass. EXTREMITIES: No pedal edema, cyanosis, or clubbing. NEUROLOGIC: Cranial nerves II through XII are grossly intact with no focal sensorimotor deficit. Muscle strength 5/5 in all extremities. Sensation intact. Gait not checked. PSYCHIATRIC: The patient is alert and oriented x 1.  SKIN: Warm, dry, and intact without obvious rash, lesion, or ulcer with the exception of a superficial skin tear over the left lateral elbow   Labs on Admission: I have personally reviewed following labs and imaging studies  CBC:  Recent Labs Lab 09/29/16 1841  WBC 15.9*  HGB 12.6*  HCT 38.0*  MCV 94.8  PLT 219   Basic Metabolic Panel:  Recent Labs Lab 09/29/16 1841  NA 137  K 4.3  CL 102  CO2 28  GLUCOSE 128*  BUN 14  CREATININE 0.89  CALCIUM 9.4   GFR: Estimated Creatinine Clearance: 83.6 mL/min (by C-G formula based on SCr of 0.89 mg/dL). Liver Function Tests:  Recent Labs Lab 09/29/16 1841  AST 22  ALT <5*  ALKPHOS 64  BILITOT 0.7  PROT 7.4  ALBUMIN 4.0   No results for input(s): LIPASE, AMYLASE in the last 168 hours. No results for input(s): AMMONIA in the last 168 hours. Coagulation Profile: No results for input(s): INR, PROTIME in the last 168 hours. Cardiac Enzymes:  Recent Labs Lab 09/29/16 1841  TROPONINI 0.04*   BNP (last 3 results) No results for input(s): PROBNP in the last 8760 hours. HbA1C: No results for input(s): HGBA1C in the last 72 hours. CBG: No results for input(s): GLUCAP in the last 168 hours. Lipid Profile: No results for input(s): CHOL, HDL, LDLCALC, TRIG, CHOLHDL, LDLDIRECT in the last 72 hours. Thyroid Function Tests: No results for input(s): TSH, T4TOTAL, FREET4, T3FREE, THYROIDAB in the last 72 hours. Anemia Panel: No results for input(s): VITAMINB12, FOLATE, FERRITIN, TIBC, IRON, RETICCTPCT in the last 72 hours. Urine analysis:    Component Value Date/Time   COLORURINE YELLOW (A) 09/29/2016 2113   APPEARANCEUR  CLEAR (A) 09/29/2016 2113   LABSPEC 1.026 09/29/2016 2113   PHURINE 5.0 09/29/2016 2113   GLUCOSEU NEGATIVE 09/29/2016 2113   HGBUR NEGATIVE 09/29/2016 2113   BILIRUBINUR NEGATIVE 09/29/2016 2113   KETONESUR 5 (A) 09/29/2016 2113   PROTEINUR 30 (A) 09/29/2016 2113   NITRITE NEGATIVE 09/29/2016 2113   LEUKOCYTESUR NEGATIVE 09/29/2016 2113   Sepsis Labs: @LABRCNTIP (procalcitonin:4,lacticidven:4) ) Recent Results (from the past 240 hour(s))  Rapid Influenza A&B Antigens ([facility] only)     Status: None   Collection Time: 09/29/16  9:11 PM  Result Value Ref Range Status   Influenza A ([facility]) NEGATIVE NEGATIVE Final   Influenza B ([facility]) NEGATIVE NEGATIVE Final     Radiological Exams on Admission: Dg Chest 2 View  Result Date: 09/29/2016 CLINICAL DATA:  Pain following fall EXAM: CHEST  2 VIEW COMPARISON:  Chest  radiograph Feb 13, 2016; chest CT Feb 13, 2016 FINDINGS: There is patchy opacity in the right base region concerning for early pneumonia. Lungs elsewhere are clear. Heart size and pulmonary vascularity are within normal limits. There is atherosclerotic calcification in the aorta. Patient is status post coronary artery bypass grafting. No bone lesions. IMPRESSION: Patchy opacity in right base concerning for early pneumonia. Lungs elsewhere clear. Stable cardiac silhouette. There is aortic atherosclerosis. Followup PA and lateral chest radiographs recommended in 3-4 weeks following trial of antibiotic therapy to ensure resolution and exclude underlying malignancy. Electronically Signed   By: Bretta Bang III M.D.   On: 09/29/2016 21:40    EKG: Normal sinus rhythm at 77 bpm with leftward axis and nonspecific ST-T wave changes.   Assessment/Plan Active Problems:   Community acquired pneumonia    This is a 71 y.o. male with a history of Parkinson's disease, CAD, depression, GERD, anemia, depression, hyperlipidemia, hypertension, osteoarthritis, peripheral vascular disease now being  admitted with: 1. Altered mental status secondary to community-acquired pneumonia-admit to inpatient for IV Levaquin, IV fluids, follow blood and sputum cultures, expectorants, nebulizers, O2 therapy as needed 2. Elevated troponin, mild-we'll trend trops. Repeat EKG in a.m. 3. Anemia, chronic at baseline-repeat CBC in the morning 4. History of Parkinson's-continue Sinemet, amantadine, Requip, Ditropan 5. History of coronary artery disease-continue aspirin, Plavix 6. History of hyperlipidemia-continue Crestor 7. History of depression-continue Wellbutrin and Seroquel 8. History of dementia-continue Aricept 9. History of GERD-continue Prilosec  Admission status: Inpatient, telemetry IV Fluids: IV normal saline Diet/Nutrition: Heart healthy Consults called: None  DVT Px: Lovenox, SCDs and early ambulation Code Status: Full Code  Disposition Plan: To home in 1-2 days   All the records are reviewed and case discussed with ED provider. Management plans discussed with the patient and/or family who express understanding and agree with plan of care.  Zoriah Pulice D.O. on 09/29/2016 at 10:46 PM Between 7am to 6pm - Pager - [phone] After 6pm go to [email or web address] - Social research officer, government [facility] [place] [facility] Office [phone] CC: Primary care physician; Marguarite Arbour, MD  09/29/2016, 10:46 PM

## 2016-09-29 NOTE — ED Provider Notes (Signed)
[facility] Emergency Department Provider Note  Time seen: 9:18 PM  I have reviewed the triage vital signs and the nursing notes.   HISTORY  Chief Complaint Fall and Altered Mental Status    HPI [name] is a 71 y.o. male with a past medical history of Parkinson's disease who presents to the emergency department for a fall and increased confusion. According to the wife the patient fell hitting his left elbow on a table. Denies hitting his head. Denies LOC. She states the real reason they brought him was because he has been somewhat confused for the past 2 days. Patient is on a medication for his Parkinson's which helps limit hallucinations per the wife. He was taking 2 tablets of this medication each day however began getting confused so his doctor Bactrim down to 1 tablet per day. She states approximately 2 or 3 weeks ago they increased again to 2 tablets daily. She states over the past 2 days he has been somewhat more confused. Currently the patient is alert and oriented 4. Wife states he has had congestion with a mild cough over the past one week but no known fever. Patient denies dysuria, chest pain or trouble breathing. Denies abdominal pain nausea vomiting or diarrhea.  Past Medical History:  Diagnosis Date  . Angina pectoris (HCC)   . Arthritis    back  . Depression   . Heart disease   . Parkinson disease [facility])     Patient Active Problem List   Diagnosis Date Noted  . Arteriosclerosis of coronary artery 02/11/2016  . Back pain, chronic 02/11/2016  . Feeling bilious 02/11/2016  . Fatigue 02/11/2016  . Acid reflux 02/11/2016  . HLD (hyperlipidemia) 02/11/2016  . BP (high blood pressure) 02/11/2016  . Arthritis, degenerative 02/11/2016  . Peripheral vascular disease (HCC) 02/11/2016  . Parkinson's disease (HCC) 12/15/2015  . Neuritis or radiculitis due to rupture of lumbar intervertebral disc 03/26/2015  . Absolute anemia 04/18/2014  .  Hallucination 06/12/2013  . CAD, multiple vessel 10/30/2012  . Cataract 10/30/2012  . Myocardial infarction 11/26/2002    Past Surgical History:  Procedure Laterality Date  . APPENDECTOMY    . CARDIAC SURGERY     stents on 12/13/02; 09/09/08; 12/16/10  . EYE SURGERY     cataracts  . KNEE ARTHROSCOPY Bilateral   . SHOULDER SURGERY Right    benign mass removed  . TONSILLECTOMY      Prior to Admission medications   Medication Sig Start Date End Date Taking? Authorizing Provider  amantadine (SYMMETREL) 50 MG/5ML solution Take 100 mg by mouth 2 (two) times daily. 10ml @@ 9am, 11pm    Historical Provider, MD  aspirin 81 MG tablet Take 81 mg by mouth daily.    Historical Provider, MD  buPROPion (WELLBUTRIN) 75 MG tablet Take 75 mg by mouth once. 9am    Historical Provider, MD  carbidopa-levodopa (SINEMET IR) 25-100 MG per tablet Take 1-1.5 tablets by mouth 5 (five) times daily. 1 1/2@9am ; 1 1/2@11am , 2pm, 4pm; 1 @5 :30pm    Historical Provider, MD  Carbidopa-Levodopa (SINEMET PO) Take by mouth. ER 25/100 at 11pm    Historical Provider, MD  clopidogrel (PLAVIX) 75 MG tablet Take 75 mg by mouth daily. 12pm    Historical Provider, MD  donepezil (ARICEPT) 5 MG tablet Take 10 mg by mouth at bedtime.     Historical Provider, MD  glycopyrrolate (ROBINUL) 1 MG tablet Take 0.5-1 mg by mouth 2 (two) times daily. 1 @  9am, 1/2@11pm     Historical Provider, MD  nitroGLYCERIN (NITROSTAT) 0.4 MG SL tablet Place 0.4 mg under the tongue every 5 (five) minutes as needed for chest pain.    Historical Provider, MD  omeprazole (PRILOSEC) 40 MG capsule Take 40 mg by mouth daily. 11pm    Historical Provider, MD  QUEtiapine (SEROQUEL) 50 MG tablet Take 25 mg by mouth at bedtime.    Historical Provider, MD  rOPINIRole (REQUIP) 1 MG tablet Take 1 mg by mouth 2 (two) times daily. 1 @9am , 2pm    Historical Provider, MD  rosuvastatin (CRESTOR) 5 MG tablet Take 5 mg by mouth at bedtime.    Historical Provider, MD  vitamin  B-12 (CYANOCOBALAMIN) 1000 MCG tablet Take 1,000 mcg by mouth daily.    Historical Provider, MD    No Known Allergies  Family History  Problem Relation Age of Onset  . Heart disease Father     Social History Social History  Substance Use Topics  . Smoking status: Former Smoker    Packs/day: 1.50    Years: 25.00    Types: Cigarettes, Cigars    Quit date: 09/29/1985  . Smokeless tobacco: Former NeurosurgeonUser    Quit date: 09/29/1985  . Alcohol use 4.2 oz/week    7 Glasses of wine per week    Review of Systems Constitutional: Negative for fever. ENT: Congestion 1 week. Cardiovascular: Negative for chest pain. Respiratory: Negative for shortness of breath. Positive for cough. Gastrointestinal: Negative for abdominal pain, vomiting and diarrhea. Genitourinary: Negative for dysuria. Neurological: Negative for headache 10-point ROS otherwise negative.  ____________________________________________   PHYSICAL EXAM:  VITAL SIGNS: ED Triage Vitals  Enc Vitals Group     BP 09/29/16 1840 (!) 111/56     Pulse Rate 09/29/16 1838 72     Resp 09/29/16 1838 18     Temp 09/29/16 1838 98 F (36.7 C)     Temp Source 09/29/16 1838 Oral     SpO2 09/29/16 1838 100 %     Weight 09/29/16 1836 189 lb (85.7 kg)     Height 09/29/16 1836 6' (1.829 m)     Head Circumference --      Peak Flow --      Pain Score 09/29/16 1839 5     Pain Loc --      Pain Edu? --      Excl. in GC? --     Constitutional: Alert and oriented. Well appearing and in no distress.Mild tremor. Eyes: Normal exam ENT   Head: Normocephalic and atraumatic.   Nose: Mild rhinorrhea.   Mouth/Throat: Mucous membranes are moist. Cardiovascular: Normal rate, regular rhythm.  Respiratory: Normal respiratory effort without tachypnea nor retractions. Breath sounds are clear  Gastrointestinal: Soft and nontender. No distention.  Musculoskeletal: Nontender with normal range of motion in all extremities Neurologic:  Normal  speech and language. No gross focal neurologic deficits  Skin:  Skin is warm, dry and intact.  Psychiatric: Mood and affect are normal.  ____________________________________________    EKG  EKG reviewed and interpreted by myself shows normal sinus rhythm at 77 bpm, narrow QRS, left axis deviation, slightly prolonged QTC 509 ms otherwise largely within normal intervals. Nonspecific ST changes. Significant left in appearance due to Parkinson's tremor.  ____________________________________________    RADIOLOGY  Right lower lobe opacity  ____________________________________________   INITIAL IMPRESSION / ASSESSMENT AND PLAN / ED COURSE  Pertinent labs & imaging results that were available during my care of the patient were  reviewed by me and considered in my medical decision making (see chart for details).  Patient presents to the emergency department for a fall today, abrasion/contusion to the left elbow but good range of motion. Light state somewhat increased confusion over the past 2 days. States this has happened previously with a medication that the patient takes for Parkinson's. Patient's labs show an elevated white blood cell count of 15. Given the patient's recent mild cough and congestion we'll check an influenza swab. We'll obtain a chest x-ray and urinalysis. No signs of head trauma, denies hitting his head or LOC. Do not believe head imaging is needed.  Wife states the patient has increasing confusion, and to get out of bed. States this is very abnormal for him. Chest x-ray is consistent with a lower lobe pneumonia. White blood cell count of 15,000 support system diagnosis as well. We will admit the patient to the hospital for community-acquired pneumonia.  ____________________________________________   FINAL CLINICAL IMPRESSION(S) / ED DIAGNOSES  Confusion Fall Community-acquired pneumonia   Minna Antis, MD 09/29/16 2213

## 2016-09-29 NOTE — ED Notes (Addendum)
Pt brought in by family with a fall at home.  Pt fell and struck a marble top table.   No loc.  No vomiting.   Pt has parkinsons and today has been more confused per family.  Pt alert and oriented now.  md at bedside.

## 2016-09-29 NOTE — ED Notes (Signed)
Pt has skin tear to left elbow from fall.

## 2016-09-29 NOTE — ED Notes (Signed)
Pharmacy called for meds and states they are very busy and will send meds asap.

## 2016-09-30 DIAGNOSIS — E440 Moderate protein-calorie malnutrition: Secondary | ICD-10-CM | POA: Insufficient documentation

## 2016-09-30 LAB — CBC
HCT: 35.6 % — ABNORMAL LOW (ref 40.0–52.0)
Hemoglobin: 12.2 g/dL — ABNORMAL LOW (ref 13.0–18.0)
MCH: 32 pg (ref 26.0–34.0)
MCHC: 34.2 g/dL (ref 32.0–36.0)
MCV: 93.7 fL (ref 80.0–100.0)
Platelets: 187 10*3/uL (ref 150–440)
RBC: 3.8 MIL/uL — ABNORMAL LOW (ref 4.40–5.90)
RDW: 13.4 % (ref 11.5–14.5)
WBC: 13.1 10*3/uL — ABNORMAL HIGH (ref 3.8–10.6)

## 2016-09-30 LAB — TROPONIN I
TROPONIN I: 0.11 ng/mL — AB (ref <0.03)
TROPONIN I: 0.12 ng/mL — AB (ref <0.03)
Troponin I: 0.1 ng/mL (ref <0.03)

## 2016-09-30 LAB — BASIC METABOLIC PANEL
Anion gap: 9 (ref 5–15)
BUN: 11 mg/dL (ref 6–20)
CALCIUM: 9.1 mg/dL (ref 8.9–10.3)
CO2: 25 mmol/L (ref 22–32)
CREATININE: 0.92 mg/dL (ref 0.61–1.24)
Chloride: 104 mmol/L (ref 101–111)
GFR calc Af Amer: >60 mL/min (ref >60)
GFR calc non Af Amer: >60 mL/min (ref >60)
GLUCOSE: 137 mg/dL — AB (ref 65–99)
Potassium: 3.5 mmol/L (ref 3.5–5.1)
Sodium: 138 mmol/L (ref 135–145)

## 2016-09-30 MED ORDER — DEXTROMETHORPHAN POLISTIREX ER 30 MG/5ML PO SUER
30.0000 mg | Freq: Two times a day (BID) | ORAL | Status: DC
  Administered 2016-09-30 – 2016-10-02 (×6): 30 mg via ORAL
  Filled 2016-09-30 (×7): qty 5

## 2016-09-30 MED ORDER — IPRATROPIUM-ALBUTEROL 0.5-2.5 (3) MG/3ML IN SOLN: 3.0000 mL | Freq: Four times a day (QID) | RESPIRATORY_TRACT | Status: DC | PRN

## 2016-09-30 MED ORDER — BUPROPION HCL 75 MG PO TABS
75.0000 mg | ORAL_TABLET | Freq: Every day | ORAL | Status: DC
  Administered 2016-09-30 – 2016-10-02 (×3): 75 mg via ORAL
  Filled 2016-09-30 (×3): qty 1

## 2016-09-30 MED ORDER — BISACODYL 5 MG PO TBEC: 5.0000 mg | DELAYED_RELEASE_TABLET | Freq: Every day | ORAL | Status: DC | PRN

## 2016-09-30 MED ORDER — DM-GUAIFENESIN ER 30-600 MG PO TB12: 1.0000 | ORAL_TABLET | Freq: Two times a day (BID) | ORAL | Status: DC

## 2016-09-30 MED ORDER — BUPROPION HCL 75 MG PO TABS: 75.0000 mg | ORAL_TABLET | Freq: Once | ORAL | Status: DC

## 2016-09-30 MED ORDER — MIDODRINE HCL 5 MG PO TABS: 2.5000 mg | ORAL_TABLET | Freq: Three times a day (TID) | ORAL | Status: DC | PRN

## 2016-09-30 MED ORDER — SODIUM CHLORIDE 0.9 % IV SOLN
INTRAVENOUS | Status: DC
  Administered 2016-09-30 – 2016-10-01 (×3): via INTRAVENOUS

## 2016-09-30 MED ORDER — CARBIDOPA-LEVODOPA 25-100 MG PO TABS: 1.0000 | ORAL_TABLET | Freq: Every day | ORAL | Status: DC

## 2016-09-30 MED ORDER — ONDANSETRON HCL 4 MG PO TABS
4.0000 mg | ORAL_TABLET | Freq: Four times a day (QID) | ORAL | Status: DC | PRN
  Administered 2016-09-30: 20:00:00 4 mg via ORAL
  Filled 2016-09-30: qty 1

## 2016-09-30 MED ORDER — LEVOFLOXACIN IN D5W 750 MG/150ML IV SOLN
750.0000 mg | INTRAVENOUS | Status: DC
  Administered 2016-09-30: 21:00:00 750 mg via INTRAVENOUS
  Filled 2016-09-30 (×2): qty 150

## 2016-09-30 MED ORDER — CLOPIDOGREL BISULFATE 75 MG PO TABS
75.0000 mg | ORAL_TABLET | Freq: Every day | ORAL | Status: DC
  Administered 2016-09-30 – 2016-10-02 (×3): 75 mg via ORAL
  Filled 2016-09-30 (×3): qty 1

## 2016-09-30 MED ORDER — SENNOSIDES-DOCUSATE SODIUM 8.6-50 MG PO TABS: 1.0000 | ORAL_TABLET | Freq: Every evening | ORAL | Status: DC | PRN

## 2016-09-30 MED ORDER — ROPINIROLE HCL 1 MG PO TABS
1.0000 mg | ORAL_TABLET | Freq: Two times a day (BID) | ORAL | Status: DC
  Administered 2016-09-30 – 2016-10-02 (×5): 1 mg via ORAL
  Filled 2016-09-30 (×5): qty 1

## 2016-09-30 MED ORDER — PANTOPRAZOLE SODIUM 40 MG PO TBEC
40.0000 mg | DELAYED_RELEASE_TABLET | Freq: Every day | ORAL | Status: DC
Start: 2016-09-30 — End: 2016-10-02
  Administered 2016-09-30 – 2016-10-02 (×3): 40 mg via ORAL
  Filled 2016-09-30 (×3): qty 1

## 2016-09-30 MED ORDER — OXYCODONE HCL 5 MG PO TABS: 5.0000 mg | ORAL_TABLET | ORAL | Status: DC | PRN

## 2016-09-30 MED ORDER — ASPIRIN EC 81 MG PO TBEC
81.0000 mg | DELAYED_RELEASE_TABLET | Freq: Every day | ORAL | Status: DC
  Administered 2016-09-30 – 2016-10-02 (×3): 81 mg via ORAL
  Filled 2016-09-30 (×3): qty 1

## 2016-09-30 MED ORDER — ACETAMINOPHEN 325 MG PO TABS
650.0000 mg | ORAL_TABLET | Freq: Four times a day (QID) | ORAL | Status: DC | PRN
  Administered 2016-09-30 – 2016-10-02 (×2): 650 mg via ORAL
  Filled 2016-09-30 (×2): qty 2

## 2016-09-30 MED ORDER — BUPROPION HCL ER (XL) 150 MG PO TB24: 75.0000 mg | ORAL_TABLET | Freq: Every day | ORAL | Status: DC

## 2016-09-30 MED ORDER — MAGNESIUM CITRATE PO SOLN: 1.0000 | Freq: Once | ORAL | Status: DC | PRN

## 2016-09-30 MED ORDER — ACETAMINOPHEN 650 MG RE SUPP: 650.0000 mg | Freq: Four times a day (QID) | RECTAL | Status: DC | PRN

## 2016-09-30 MED ORDER — ENOXAPARIN SODIUM 40 MG/0.4ML ~~LOC~~ SOLN
40.0000 mg | Freq: Every day | SUBCUTANEOUS | Status: DC
  Administered 2016-09-30 – 2016-10-01 (×3): 40 mg via SUBCUTANEOUS
  Filled 2016-09-30 (×3): qty 0.4

## 2016-09-30 MED ORDER — ONDANSETRON HCL 4 MG/2ML IJ SOLN: 4.0000 mg | Freq: Four times a day (QID) | INTRAMUSCULAR | Status: DC | PRN

## 2016-09-30 MED ORDER — PIMAVANSERIN TARTRATE 17 MG PO TABS
34.0000 mg | ORAL_TABLET | Freq: Every day | ORAL | Status: DC
  Filled 2016-09-30: qty 2

## 2016-09-30 MED ORDER — OXYBUTYNIN CHLORIDE 5 MG PO TABS
5.0000 mg | ORAL_TABLET | Freq: Three times a day (TID) | ORAL | Status: DC
  Administered 2016-09-30 – 2016-10-02 (×6): 5 mg via ORAL
  Filled 2016-09-30 (×7): qty 1

## 2016-09-30 MED ORDER — NITROGLYCERIN 0.4 MG SL SUBL: 0.4000 mg | SUBLINGUAL_TABLET | SUBLINGUAL | Status: DC | PRN

## 2016-09-30 MED ORDER — ENSURE ENLIVE PO LIQD
237.0000 mL | Freq: Three times a day (TID) | ORAL | Status: DC
  Administered 2016-09-30 – 2016-10-01 (×4): 237 mL via ORAL

## 2016-09-30 MED ORDER — CARBIDOPA-LEVODOPA 25-100 MG PO TABS
1.5000 | ORAL_TABLET | ORAL | Status: DC
  Administered 2016-09-30 – 2016-10-02 (×10): 1.5 via ORAL
  Filled 2016-09-30 (×10): qty 2

## 2016-09-30 MED ORDER — VITAMIN B-12 1000 MCG PO TABS
1000.0000 ug | ORAL_TABLET | Freq: Every day | ORAL | Status: DC
  Administered 2016-09-30 – 2016-10-02 (×3): 1000 ug via ORAL
  Filled 2016-09-30 (×3): qty 1

## 2016-09-30 MED ORDER — AMANTADINE HCL 50 MG/5ML PO SYRP
100.0000 mg | ORAL_SOLUTION | Freq: Two times a day (BID) | ORAL | Status: DC
  Filled 2016-09-30 (×6): qty 10

## 2016-09-30 MED ORDER — GUAIFENESIN ER 600 MG PO TB12
600.0000 mg | ORAL_TABLET | Freq: Two times a day (BID) | ORAL | Status: DC
  Administered 2016-09-30 – 2016-10-02 (×6): 600 mg via ORAL
  Filled 2016-09-30 (×6): qty 1

## 2016-09-30 MED ORDER — CARBIDOPA-LEVODOPA 25-100 MG PO TABS
1.0000 | ORAL_TABLET | Freq: Every day | ORAL | Status: DC
  Administered 2016-09-30 – 2016-10-01 (×2): 1 via ORAL
  Filled 2016-09-30 (×3): qty 1

## 2016-09-30 MED ORDER — IBUPROFEN 600 MG PO TABS
600.0000 mg | ORAL_TABLET | Freq: Once | ORAL | Status: AC
  Administered 2016-09-30: 600 mg via ORAL
  Filled 2016-09-30: qty 1

## 2016-09-30 NOTE — [place] FL2 (Signed)
MEDICAID FL2 LEVEL OF CARE SCREENING TOOL     IDENTIFICATION  Patient Name: [name] Birthdate: 1946/05/29 Sex: male Admission Date (Current Location): 09/29/2016  [place] and [place] Number:  Chiropodist and Address:  [facility], [address], [place], [place] [address]      Provider Number: [id number]  Attending Physician Name and Address:  Ramonita Lab, MD  Relative Name and Phone Number:       Current Level of Care: Hospital Recommended Level of Care: Skilled Nursing Facility Prior Approval Number:    Date Approved/Denied:   PASRR Number:  [id number])  Discharge Plan: SNF    Current Diagnoses: Patient Active Problem List   Diagnosis Date Noted  . Malnutrition of moderate degree 09/30/2016  . Community acquired pneumonia 09/29/2016  . Arteriosclerosis of coronary artery 02/11/2016  . Back pain, chronic 02/11/2016  . Feeling bilious 02/11/2016  . Fatigue 02/11/2016  . Acid reflux 02/11/2016  . HLD (hyperlipidemia) 02/11/2016  . BP (high blood pressure) 02/11/2016  . Arthritis, degenerative 02/11/2016  . Peripheral vascular disease (HCC) 02/11/2016  . Parkinson's disease (HCC) 12/15/2015  . Neuritis or radiculitis due to rupture of lumbar intervertebral disc 03/26/2015  . Absolute anemia 04/18/2014  . Hallucination 06/12/2013  . CAD, multiple vessel 10/30/2012  . Cataract 10/30/2012  . Myocardial infarction 11/26/2002    Orientation RESPIRATION BLADDER Height & Weight     Self, Time  Normal Continent Weight: 189 lb 4.8 oz (85.9 kg) Height:  6' (182.9 cm)  BEHAVIORAL SYMPTOMS/MOOD NEUROLOGICAL BOWEL NUTRITION STATUS   (none)  (none) Continent Diet (Diet: Heart Healthy )  AMBULATORY STATUS COMMUNICATION OF NEEDS Skin   Extensive Assist Verbally Normal                       Personal Care Assistance Level of Assistance  Bathing, Feeding, Dressing Bathing Assistance: Limited assistance Feeding  assistance: Independent Dressing Assistance: Limited assistance     Functional Limitations Info  Sight, Hearing, Speech Sight Info: Adequate Hearing Info: Adequate Speech Info: Adequate    SPECIAL CARE FACTORS FREQUENCY  PT (By licensed PT), OT (By licensed OT)     PT Frequency:  (5) OT Frequency:  (5)            Contractures      Additional Factors Info  Code Status, Allergies Code Status Info:  (Full Code. ) Allergies Info:  (No Known Allergies. )           Current Medications (09/30/2016):  This is the current hospital active medication list Current Facility-Administered Medications  Medication Dose Route Frequency Provider Last Rate Last Dose  . 0.9 %  sodium chloride infusion   Intravenous Continuous Alexis Hugelmeyer, DO 75 mL/hr at 09/30/16 1431    . acetaminophen (TYLENOL) tablet 650 mg  650 mg Oral Q6H PRN Alexis Hugelmeyer, DO       Or  . acetaminophen (TYLENOL) suppository 650 mg  650 mg Rectal Q6H PRN Alexis Hugelmeyer, DO      . amantadine (SYMMETREL) solution 100 mg  100 mg Oral BID Alexis Hugelmeyer, DO      . aspirin EC tablet 81 mg  81 mg Oral Daily Alexis Hugelmeyer, DO   81 mg at 09/30/16 0936  . bisacodyl (DULCOLAX) EC tablet 5 mg  5 mg Oral Daily PRN Alexis Hugelmeyer, DO      . buPROPion [facility]) tablet 75 mg  75 mg Oral Daily [employer]  Hugelmeyer, DO   75 mg at 09/30/16 1147  . carbidopa-levodopa (SINEMET IR) 25-100 MG per tablet immediate release 1.5 tablet  1.5 tablet Oral 4 times per day [employer] Hugelmeyer, DO   1.5 tablet at 09/30/16 1631   And  . carbidopa-levodopa (SINEMET IR) 25-100 MG per tablet immediate release 1 tablet  1 tablet Oral Daily Alexis Hugelmeyer, DO      . Carbidopa-Levodopa ER (SINEMET CR) 25-100 MG tablet controlled release 1 tablet  1 tablet Oral QHS Alexis Hugelmeyer, DO   1 tablet at 09/30/16 0011  . clopidogrel (PLAVIX) tablet 75 mg  75 mg Oral Daily Alexis Hugelmeyer, DO   75 mg at 09/30/16 0936  . dextromethorphan  (DELSYM) 30 MG/5ML liquid 30 mg  30 mg Oral BID Alexis Hugelmeyer, DO   30 mg at 09/30/16 [id number]   And  . guaiFENesin (MUCINEX) 12 hr tablet 600 mg  600 mg Oral BID Alexis Hugelmeyer, DO   600 mg at 09/30/16 0936  . donepezil (ARICEPT) tablet 10 mg  10 mg Oral QHS Alexis Hugelmeyer, DO   10 mg at 09/30/16 0012  . enoxaparin (LOVENOX) injection 40 mg  40 mg Subcutaneous QHS Alexis Hugelmeyer, DO   40 mg at 09/30/16 0323  . feeding supplement (ENSURE ENLIVE) (ENSURE ENLIVE) liquid 237 mL  237 mL Oral TID BM Ramonita LabAruna Dilynn Munroe, MD   237 mL at 09/30/16 1633  . ipratropium-albuterol (DUONEB) 0.5-2.5 (3) MG/3ML nebulizer solution 3 mL  3 mL Nebulization Q6H PRN Alexis Hugelmeyer, DO      . levofloxacin (LEVAQUIN) IVPB 750 mg  750 mg Intravenous Q24H Alexis Hugelmeyer, DO      . magnesium citrate solution 1 Bottle  1 Bottle Oral Once PRN Alexis Hugelmeyer, DO      . midodrine (PROAMATINE) tablet 2.5 mg  2.5 mg Oral TID PRN Alexis Hugelmeyer, DO      . nitroGLYCERIN (NITROSTAT) SL tablet 0.4 mg  0.4 mg Sublingual Q5 min PRN Alexis Hugelmeyer, DO      . ondansetron (ZOFRAN) tablet 4 mg  4 mg Oral Q6H PRN Alexis Hugelmeyer, DO       Or  . ondansetron (ZOFRAN) injection 4 mg  4 mg Intravenous Q6H PRN Alexis Hugelmeyer, DO      . oxybutynin (DITROPAN) tablet 5 mg  5 mg Oral TID Alexis Hugelmeyer, DO   5 mg at 09/30/16 1631  . oxyCODONE (Oxy IR/ROXICODONE) immediate release tablet 5 mg  5 mg Oral Q4H PRN Alexis Hugelmeyer, DO      . pantoprazole (PROTONIX) EC tablet 40 mg  40 mg Oral QAC breakfast Alexis Hugelmeyer, DO   40 mg at 09/30/16 0935  . Pimavanserin Tartrate TABS 34 mg  34 mg Oral Daily Alexis Hugelmeyer, DO      . QUEtiapine (SEROQUEL) tablet 50 mg  50 mg Oral QHS Alexis Hugelmeyer, DO   50 mg at 09/30/16 0012  . rOPINIRole (REQUIP) tablet 1 mg  1 mg Oral BID Alexis Hugelmeyer, DO   1 mg at 09/30/16 1425  . rosuvastatin (CRESTOR) tablet 5 mg  5 mg Oral QHS Alexis Hugelmeyer, DO   5 mg at 09/30/16 0012  .  senna-docusate (Senokot-S) tablet 1 tablet  1 tablet Oral QHS PRN Alexis Hugelmeyer, DO      . vitamin B-12 (CYANOCOBALAMIN) tablet 1,000 mcg  1,000 mcg Oral Daily Alexis Hugelmeyer, DO   1,000 mcg at 09/30/16 [id number]     Discharge Medications: Please see discharge summary for a list  of discharge medications.  Relevant Imaging Results:  Relevant Lab Results:   Additional Information  (SSN: [id number])  Sample, Darleen Crocker, LCSW

## 2016-09-30 NOTE — Progress Notes (Signed)
Pharmacy Antibiotic Note  [name] is a 71 y.o. male admitted on 09/29/2016 with CAP.  Pharmacy has been consulted for levofloxacin dosing.  Plan: Levofloxacin 750 mg IV Q24H  Height: 6' (182.9 cm) Weight: 189 lb 4.8 oz (85.9 kg) IBW/kg (Calculated) : 77.6  Temp (24hrs), Avg:98.8 F (37.1 C), Min:98 F (36.7 C), Max:99.6 F (37.6 C)   Recent Labs Lab 09/29/16 1841  WBC 15.9*  CREATININE 0.89    Estimated Creatinine Clearance: 83.6 mL/min (by C-G formula based on SCr of 0.89 mg/dL).    No Known Allergies  Thank you for allowing pharmacy to be a part of this patient's care.  Carola FrostNathan A Jammie Troup, Pharm.D., BCPS Clinical Pharmacist 09/30/2016 2:57 AM

## 2016-09-30 NOTE — Evaluation (Signed)
Physical Therapy Evaluation Patient Details Name: Consepcion Hearinghomas B Ehrich MRN: [id number] DOB: Sep 14, 1946 Today's Date: 09/30/2016   History of Present Illness  Pt admitted for community acquired pneumonia. Pt with upward trending troponin, however no complaints of chest pain and no cardio consult. Pt with history of Parkinson's disease, CAD, depression, GERD, anemia, and HTN. Pt now with complaints of AMS and fall at home.  Clinical Impression  Pt is a pleasant 71 year old male who was admitted for CAP. Pt performs bed mobility with max assist and unable to further perform transfers and ambulation with at this time. Pt demonstrates deficits with balance/strength. Pt unable to maintain seated balance at this time without assistance. Pt is currently not at baseline level. Would benefit from skilled PT to address above deficits and promote optimal return to PLOF; recommend transition to STR upon discharge from acute hospitalization.       Follow Up Recommendations SNF    Equipment Recommendations       Recommendations for Other Services       Precautions / Restrictions Precautions Precautions: Fall Restrictions Weight Bearing Restrictions: No      Mobility  Bed Mobility Overal bed mobility: Needs Assistance Bed Mobility: Supine to Sit     Supine to sit: Max assist     General bed mobility comments: assist required for bed mobility including initiating sliding B LEs off bed. Pt needs heavy cues for using UE for bringing at EOB. Once seated at EOB, pt unable to maintain seated balance with LOB in post lateral direction. Pt able to self correct with facilitation, however inconsistently.   Transfers                 General transfer comment: not safe to perform at this time secondary to poor balance.  Ambulation/Gait                Stairs            Wheelchair Mobility    Modified Rankin (Stroke Patients Only)       Balance Overall balance assessment: History  of Falls;Needs assistance Sitting-balance support: Feet supported;Bilateral upper extremity supported Sitting balance-Leahy Scale: Poor                                       Pertinent Vitals/Pain Pain Assessment: No/denies pain    Home Living Family/patient expects to be discharged to:: Private residence Living Arrangements: Spouse/significant other Available Help at Discharge: Family;Available 24 hours/day Type of Home: House Home Access: Stairs to enter Entrance Stairs-Rails: Can reach both Entrance Stairs-Number of Steps: 4 Home Layout: One level Home Equipment: Walker - 2 wheels;Cane - single point      Prior Function Level of Independence: Independent         Comments: Pt doesn't use AD, however has had multiple falls. Per wife, he refuses to use AD.     Hand Dominance        Extremity/Trunk Assessment   Upper Extremity Assessment Upper Extremity Assessment: Generalized weakness (B UE grossly 4+/5)    Lower Extremity Assessment Lower Extremity Assessment: Generalized weakness (B LE grossly 3/5)       Communication   Communication: No difficulties  Cognition Arousal/Alertness: Lethargic Behavior During Therapy: WFL for tasks assessed/performed Overall Cognitive Status: Impaired/Different from baseline  General Comments      Exercises Other Exercises Other Exercises: Pt performed seated ther-ex including LAQs, only able to perform around 5 reps prior to fatigue. Pt also performed supine ther-ex including SLRs and hip abd/add x 5 reps with min assist prior to fatigue.   Assessment/Plan    PT Assessment Patient needs continued PT services  PT Problem List Decreased strength;Decreased activity tolerance;Decreased balance;Decreased mobility;Decreased safety awareness          PT Treatment Interventions DME instruction;Gait training;Therapeutic exercise    PT Goals (Current goals can be found in the Care  Plan section)  Acute Rehab PT Goals Patient Stated Goal: to get stronger PT Goal Formulation: With patient Time For Goal Achievement: 10/14/16 Potential to Achieve Goals: Good    Frequency Min 2X/week   Barriers to discharge        Co-evaluation               End of Session Equipment Utilized During Treatment: Gait belt Activity Tolerance: Patient tolerated treatment well Patient left: in bed;with bed alarm set Nurse Communication: Mobility status         Time: 1610-9604 PT Time Calculation (min) (ACUTE ONLY): 19 min   Charges:   PT Evaluation $PT Eval Moderate Complexity: 1 Procedure PT Treatments $Therapeutic Exercise: 8-22 mins   PT G Codes:        Kadence Mimbs 10/04/2016, 4:10 PM Elizabeth Palau, PT, DPT [phone]

## 2016-09-30 NOTE — ED Notes (Signed)
Report called to floor nurse jackie rn.   Pt sleeping    Family at bedside.

## 2016-09-30 NOTE — Progress Notes (Signed)
Initial Nutrition Assessment  DOCUMENTATION CODES:   Non-severe (moderate) malnutrition in context of acute illness/injury; would meet criteria for severe acute malnutrition if had better understanding of recent intake  INTERVENTION:  Provide Ensure Enlive po TID between meals, each supplement provides 350 kcal and 20 grams of protein.  Encouraged adequate intake of calories and protein with meals/snacks.   NUTRITION DIAGNOSIS:   Inadequate oral intake related to other (see comment) (taste changes) as evidenced by per patient/family report, meal completion < 50%, 8 percent weight loss over 1 month.  GOAL:   Patient will meet greater than or equal to 90% of their needs  MONITOR:   PO intake, Supplement acceptance, Labs, Weight trends, I & O's  REASON FOR ASSESSMENT:   Malnutrition Screening Tool    ASSESSMENT:   71 y.o. male with a known history of Parkinson's disease, CAD, depression, GERD, anemia, depression, hyperlipidemia, hypertension, osteoarthritis, peripheral vascular disease presents to the emergency department for evaluation of altered mental status. Patient found to have PNA.   Spoke with patient and family member at bedside. Patient seemed to be answering questions appropriately for RD assessment. He reports his appetite is good and that he eats 4-5 meals per day. He enjoys steak, hamburger, other meats, and vegetables at meals. Patient denies N/V, abdominal pain, constipation/diarrhea, or difficulty chewing/swallowing. Patient does endorse recent weight loss. UBW 205 lbs. Patient reports he has lost 16 lbs (8% weight loss) over 4 weeks, which is significant for time frame. Although he had reported he had good appetite PTA, patient believes weight loss may be related to eating smaller portions unintentionally. He reports food has not tasted good for the past month.   Meal Completion: 30% per chart  Medications reviewed and include: pantoprazole, vitamin B12 1000  micrograms daily, NS @ 75 ml/hr.  Labs reviewed: Glucose 137, elevated Troponin.   Nutrition-Focused physical exam completed. Findings are no fat depletion, mild muscle depletion, and no edema.   Diet Order:  Diet Heart Room service appropriate? Yes; Fluid consistency: Thin  Skin:  Reviewed, no issues  Last BM:  Unknown  Height:   Ht Readings from Last 1 Encounters:  09/30/16 6' (1.829 m)    Weight:   Wt Readings from Last 1 Encounters:  09/30/16 189 lb 4.8 oz (85.9 kg)    Ideal Body Weight:  80.9 kg  BMI:  Body mass index is 25.67 kg/m.  Estimated Nutritional Needs:   Kcal:  2000-2160 (MSJ x 1.2-1.3)  Protein:  85-105 grams (1-1.2 grams/kg)  Fluid:  >/= 2.1 L/day (25 ml/kg)  EDUCATION NEEDS:   No education needs identified at this time  Helane RimaLeanne Shirle Provencal, MS, RD, LDN Pager: [phone] After Hours Pager: [phone]

## 2016-09-30 NOTE — Progress Notes (Signed)
[facility] [place] at [facility]   PATIENT NAME: [name]    MR#:  [id number]  DATE OF BIRTH:  [birth year]  SUBJECTIVE:  CHIEF COMPLAINT:  Patient's shortness of breath is better. Denies any chest pain  REVIEW OF SYSTEMS:  CONSTITUTIONAL: No fever, fatigue or weakness.  EYES: No blurred or double vision.  EARS, NOSE, AND THROAT: No tinnitus or ear pain.  RESPIRATORY: Reports cough, shortness of breath with exertion, denies wheezing or hemoptysis.  CARDIOVASCULAR: No chest pain, orthopnea, edema. GASTROINTESTINAL: No nausea, vomiting, diarrhea or abdominal pain.  GENITOURINARY: No dysuria, hematuria.  ENDOCRINE: No polyuria, nocturia,  HEMATOLOGY: No anemia, easy bruising or bleeding SKIN: No rash or lesion. MUSCULOSKELETAL: No joint pain or arthritis.   NEUROLOGIC: No tingling, numbness, weakness.  PSYCHIATRY: No anxiety or depression.   DRUG ALLERGIES:  No Known Allergies  VITALS:  Blood pressure 112/68, pulse 89, temperature 100 F (37.8 C), temperature source Oral, resp. rate 18, height 6' (1.829 m), weight 85.9 kg (189 lb 4.8 oz), SpO2 95 %.  PHYSICAL EXAMINATION:  GENERAL:  71 y.o.-year-old patient lying in the bed with no acute distress.  EYES: Pupils equal, round, reactive to light and accommodation. No scleral icterus. Extraocular muscles intact.  HEENT: Head atraumatic, normocephalic. Oropharynx and nasopharynx clear.  NECK:  Supple, no jugular venous distention. No thyroid enlargement, no tenderness.  LUNGS: Moderate breath sounds bilaterally, no wheezing, rales,rhonchi or crepitation. No use of accessory muscles of respiration.  CARDIOVASCULAR: S1, S2 normal. No murmurs, rubs, or gallops.  ABDOMEN: Soft, nontender, nondistended. Bowel sounds present. No organomegaly or mass.  EXTREMITIES: No pedal edema, cyanosis, or clubbing.  NEUROLOGIC: Cranial nerves II through XII are intact. Muscle strength 5/5 in all extremities. Sensation  intact. Gait not checked.  PSYCHIATRIC: The patient is alert and oriented x 3.  SKIN: No obvious rash, lesion, or ulcer.    LABORATORY PANEL:   CBC  Recent Labs Lab 09/30/16 0415  WBC 13.1*  HGB 12.2*  HCT 35.6*  PLT 187   ------------------------------------------------------------------------------------------------------------------  Chemistries   Recent Labs Lab 09/29/16 1841 09/30/16 0415  NA 137 138  K 4.3 3.5  CL 102 104  CO2 28 25  GLUCOSE 128* 137*  BUN 14 11  CREATININE 0.89 0.92  CALCIUM 9.4 9.1  AST 22  --   ALT <5*  --   ALKPHOS 64  --   BILITOT 0.7  --    ------------------------------------------------------------------------------------------------------------------  Cardiac Enzymes  Recent Labs Lab 09/30/16 1013  TROPONINI 0.12*   ------------------------------------------------------------------------------------------------------------------  RADIOLOGY:  Dg Chest 2 View  Result Date: 09/29/2016 CLINICAL DATA:  Pain following fall EXAM: CHEST  2 VIEW COMPARISON:  Chest radiograph Feb 13, 2016; chest CT Feb 13, 2016 FINDINGS: There is patchy opacity in the right base region concerning for early pneumonia. Lungs elsewhere are clear. Heart size and pulmonary vascularity are within normal limits. There is atherosclerotic calcification in the aorta. Patient is status post coronary artery bypass grafting. No bone lesions. IMPRESSION: Patchy opacity in right base concerning for early pneumonia. Lungs elsewhere clear. Stable cardiac silhouette. There is aortic atherosclerosis. Followup PA and lateral chest radiographs recommended in 3-4 weeks following trial of antibiotic therapy to ensure resolution and exclude underlying malignancy. Electronically Signed   By: Bretta Bang III M.D.   On: 09/29/2016 21:40    EKG:   Orders placed or performed during the hospital encounter of 09/29/16  . ED EKG  . ED EKG  .  EKG 12-Lead  . EKG 12-Lead  . EKG  12-Lead  . EKG 12-Lead    ASSESSMENT AND PLAN:   This is a 71 y.o. male with a history of Parkinson's disease, CAD, depression, GERD, anemia, depression, hyperlipidemia, hypertension, osteoarthritis, peripheral vascular disease now being admitted   1. Altered mental status secondary to community-acquired pneumonia- Continue  IV Levaquin, IV fluids follow blood and sputum cultures  expectorants, nebulizers, O2 therapy as needed  2. Elevated troponin secondary to demand ischemia 0.04-0.11 -0.Marland Kitchen.12 patient is asymptomatic   3. Anemia, chronic at baseline-repeat CBC in the morning  4. History of Parkinson's-continue Sinemet, amantadine, Requip, Ditropan  5. History of coronary artery disease Patient is asymptomatic  continue aspirin, Plavix  6. History of hyperlipidemia-continue Crestor  7. History of depression-continue Wellbutrin and Seroquel  8. History of dementia-continue Aricept  PT consult pending   All the records are reviewed and case discussed with Care Management/Social Workerr. Management plans discussed with the patient, family and they are in agreement.  CODE STATUS: fc  TOTAL TIME TAKING CARE OF THIS PATIENT: 37  minutes.   POSSIBLE D/C IN 1-2  DAYS, DEPENDING ON CLINICAL CONDITION.  Note: This dictation was prepared with Dragon dictation along with smaller phrase technology. Any transcriptional errors that result from this process are unintentional.   Ramonita LabGouru, Carime Dinkel M.D on 09/30/2016 at 4:04 PM  Between 7am to 6pm - Pager - [phone] After 6pm go to [email or web address] - password [email or web address] [facility]  [facility]  Office  [phone]  CC: Primary care physician; Marguarite ArbourSPARKS,JEFFREY D, MD

## 2016-09-30 NOTE — ED Notes (Signed)
Pt sleeping.  Family with pt 

## 2016-09-30 NOTE — Progress Notes (Signed)
PT Cancellation Note  Patient Details Name: Consepcion Hearinghomas B Herro MRN: [id number] DOB: [birth year]   Cancelled Treatment:    Reason Eval/Treat Not Completed: Other (comment). Consult received and chart reviewed. Pt currently eating breakfast and RN in room performing assessment. Requested to return at later time. Will re-attempt.   Bethene Hankinson 09/30/2016, 9:34 AM  Elizabeth PalauStephanie Telena Peyser, PT, DPT [phone]

## 2016-09-30 NOTE — Plan of Care (Signed)
Problem: Activity: Goal: Risk for activity intolerance will decrease Outcome: Progressing Pt's Parkinson's disease causing decrease in activity tolerance this shift with Physical Therapist's evaluation; unable to get oob this shift; uses urinal

## 2016-10-01 ENCOUNTER — Encounter
Admission: RE | Admit: 2016-10-01 | Discharge: 2016-10-01 | Disposition: A | Discharge status: Home / Self Care | Payer: PPO | Source: Ambulatory Visit | Attending: Internal Medicine | Admitting: Internal Medicine

## 2016-10-01 DIAGNOSIS — G20 Parkinson's disease: Secondary | ICD-10-CM | POA: Insufficient documentation

## 2016-10-01 DIAGNOSIS — R531 Weakness: Secondary | ICD-10-CM | POA: Insufficient documentation

## 2016-10-01 DIAGNOSIS — R05 Cough: Secondary | ICD-10-CM | POA: Insufficient documentation

## 2016-10-01 LAB — CBC
HEMATOCRIT: 34.6 % — AB (ref 40.0–52.0)
Hemoglobin: 11.7 g/dL — ABNORMAL LOW (ref 13.0–18.0)
MCH: 31.8 pg (ref 26.0–34.0)
MCHC: 33.8 g/dL (ref 32.0–36.0)
MCV: 94.1 fL (ref 80.0–100.0)
Platelets: 190 10*3/uL (ref 150–440)
RBC: 3.68 MIL/uL — ABNORMAL LOW (ref 4.40–5.90)
RDW: 13.4 % (ref 11.5–14.5)
WBC: 11.2 10*3/uL — AB (ref 3.8–10.6)

## 2016-10-01 MED ORDER — LEVOFLOXACIN 750 MG PO TABS
750.0000 mg | ORAL_TABLET | Freq: Every day | ORAL | Status: DC
  Administered 2016-10-01: 18:00:00 750 mg via ORAL
  Filled 2016-10-01: qty 1

## 2016-10-01 MED ORDER — PIMAVANSERIN TARTRATE 17 MG PO TABS
2.0000 | ORAL_TABLET | Freq: Every day | ORAL | Status: DC
  Administered 2016-10-01: 20:00:00 2 via ORAL

## 2016-10-01 MED ORDER — ORAL CARE MOUTH RINSE
15.0000 mL | Freq: Two times a day (BID) | OROMUCOSAL | Status: DC
  Administered 2016-10-01 (×2): 15 mL via OROMUCOSAL

## 2016-10-01 NOTE — Clinical Social Work Note (Addendum)
Clinical Social Work Assessment  Patient Details  Name: [name] MRN: [id number] Date of Birth: 12/10/1945  Date of referral:  10/01/16               Reason for consult:  Facility Placement                Permission sought to share information with:  Chartered certified accountant granted to share information::  Yes, Verbal Permission Granted  Name::      [place]::   [place]   Relationship::     Contact Information:     Housing/Transportation Living arrangements for the past 2 months:  [place] of Information:  Patient, Adult Children, Spouse Patient Interpreter Needed:  None Criminal Activity/Legal Involvement Pertinent to Current Situation/Hospitalization:  No - Comment as needed Significant Relationships:  Adult Children, Spouse Lives with:  Spouse Do you feel safe going back to the place where you live?  Yes Need for family participation in patient care:  Yes (Comment)  Care giving concerns:  Patient lives in [place] with his wife [name].    Social Worker assessment / plan:  Holiday representative (CSW) received SNF consult. PT is recommending SNF. CSW met with patient and his wife [name] and daughter [name] were at bedside. CSW introduced self and explained role of CSW department. Per wife patient and herself live in [place]. CSW explained that PT is recommending SNF and that Health Team will require authorization. Wife is agreeable to SNF search in [place]. FL2 complete and faxed out. CSW presented bed offers. Wife chose [employer]. Per [facility] admissions coordinator at [facility] patient can come over the weekend to room 211, (RN will call report at [phone]) if Health Team authorization is received. CSW started [employer] authorization. Health Team has an on call worker this weekend that can be reached at [phone] to follow up on authorization. CSW will continue to follow and assist as  needed.   Health Team authorization has been received. Auth # [id number], good for 7 days.   Employment status:  Retired Nurse, adult PT Recommendations:  [place] / Referral to community resources:  [place]  Patient/Family's Response to care:  Patient's wife is agreeable for patient to go to [employer]   Patient/Family's Understanding of and Emotional Response to Diagnosis, Current Treatment, and Prognosis:  Patient and family were very pleasant and thanked CSW for assistance.   Emotional Assessment Appearance:  Appears stated age Attitude/Demeanor/Rapport:    Affect (typically observed):  Accepting, Adaptable, Pleasant Orientation:  Oriented to Self, Oriented to Place, Oriented to  Time, Oriented to Situation Alcohol / Substance use:  Not Applicable Psych involvement (Current and /or in the community):  No (Comment)  Discharge Needs  Concerns to be addressed:  Discharge Planning Concerns Readmission within the last 30 days:  No Current discharge risk:  Dependent with Mobility Barriers to Discharge:  Continued Medical Work up   [employer], Veronia Beets, LCSW 10/01/2016, 4:43 PM

## 2016-10-01 NOTE — Progress Notes (Signed)
Pharmacy Antibiotic Note  Consepcion Hearinghomas B [name] is a 71 y.o. male admitted on 09/29/2016 with CAP.  Pharmacy has been consulted for levofloxacin dosing.  This is day #3 of antibiotics.  Plan: Continue levofloxacin 750 mg daily. Will change from IV to PO per P&T protocol.  Height: 6' (182.9 cm) Weight: 189 lb 4.8 oz (85.9 kg) IBW/kg (Calculated) : 77.6  Temp (24hrs), Avg:100.3 F (37.9 C), Min:98.3 F (36.8 C), Max:102 F (38.9 C)   Recent Labs Lab 09/29/16 1841 09/30/16 0415 10/01/16 0620  WBC 15.9* 13.1* 11.2*  CREATININE 0.89 0.92  --     Estimated Creatinine Clearance: 80.8 mL/min (by C-G formula based on SCr of 0.92 mg/dL).    No Known Allergies  Thank you for allowing pharmacy to be a part of this patient's care.  Cindi CarbonMary M Angelea Penny, Pharm.D., BCPS Clinical Pharmacist 10/01/2016 9:17 AM

## 2016-10-01 NOTE — Care Management Important Message (Signed)
Important Message  Patient Details  Name: Consepcion Hearinghomas B Diffley MRN: [id number] Date of Birth: 16-Oct-1945   Medicare Important Message Given:  Yes    Gwenette GreetBrenda S Armany Mano, RN 10/01/2016, 10:32 AM

## 2016-10-01 NOTE — Clinical Social Work Placement (Signed)
   CLINICAL SOCIAL WORK PLACEMENT  NOTE  Date:  10/01/2016  Patient Details  Name: Consepcion Hearinghomas B Wallen MRN: [id number] Date of Birth: 29-Sep-1945  Clinical Social Work is seeking post-discharge placement for this patient at the Skilled  Nursing Facility level of care (*CSW will initial, date and re-position this form in  chart as items are completed):  Yes   Patient/family provided with [place] Clinical Social Work Department's list of facilities offering this level of care within the geographic area requested by the patient (or if unable, by the patient's family).  Yes   Patient/family informed of their freedom to choose among providers that offer the needed level of care, that participate in Medicare, Medicaid or managed care program needed by the patient, have an available bed and are willing to accept the patient.  Yes   Patient/family informed of [facility]'s ownership interest in [facility] and [facility], as well as of the fact that they are under no obligation to receive care at these facilities.  PASRR submitted to EDS on 10/01/16     PASRR number received on 10/01/16     Existing PASRR number confirmed on       FL2 transmitted to all facilities in geographic area requested by pt/family on 10/01/16     FL2 transmitted to all facilities within larger geographic area on       Patient informed that his/her managed care company has contracts with or will negotiate with certain facilities, including the following:        Yes   Patient/family informed of bed offers received.  Patient chooses bed at  [facility] )     Physician recommends and patient chooses bed at      Patient to be transferred to   on  .  Patient to be transferred to facility by       Patient family notified on   of transfer.  Name of family member notified:        PHYSICIAN       Additional Comment:    _______________________________________________ Krisanne Lich, Darleen CrockerBailey M, LCSW 10/01/2016,  4:42 PM

## 2016-10-01 NOTE — Progress Notes (Signed)
Health Team authorization has been received for SNF. Auth # [id number], good for 7 days. [facility] to room 211, (RN will call report at [phone]).   Baker Hughes IncorporatedBailey Bernadene Garside, LCSW [phone]

## 2016-10-01 NOTE — Progress Notes (Signed)
[facility] [place] [facility]   PATIENT NAME: [name]    MR#:  [id number]  DATE OF BIRTH:  [birth year]  SUBJECTIVE:  CHIEF COMPLAINT:  Patient's shortness of breath is better. Weak   REVIEW OF SYSTEMS:  CONSTITUTIONAL: No fever, fatigue or weakness.  EYES: No blurred or double vision.  EARS, NOSE, AND THROAT: No tinnitus or ear pain.  RESPIRATORY: Reports cough, shortness of breath with exertion, denies wheezing or hemoptysis.  CARDIOVASCULAR: No chest pain, orthopnea, edema. GASTROINTESTINAL: No nausea, vomiting, diarrhea or abdominal pain.  GENITOURINARY: No dysuria, hematuria.  ENDOCRINE: No polyuria, nocturia,  HEMATOLOGY: No anemia, easy bruising or bleeding SKIN: No rash or lesion. MUSCULOSKELETAL: No joint pain or arthritis.   NEUROLOGIC: No tingling, numbness, weakness.  PSYCHIATRY: No anxiety or depression.   DRUG ALLERGIES:  No Known Allergies  VITALS:  Blood pressure 129/69, pulse 72, temperature 98 F (36.7 C), temperature source Oral, resp. rate 18, height 6' (1.829 m), weight 85.9 kg (189 lb 4.8 oz), SpO2 95 %.  PHYSICAL EXAMINATION:  GENERAL:  71 y.o.-year-old patient lying in the bed with no acute distress.  EYES: Pupils equal, round, reactive to light and accommodation. No scleral icterus. Extraocular muscles intact.  HEENT: Head atraumatic, normocephalic. Oropharynx and nasopharynx clear.  NECK:  Supple, no jugular venous distention. No thyroid enlargement, no tenderness.  LUNGS: Moderate breath sounds bilaterally, no wheezing, rales,rhonchi or crepitation. No use of accessory muscles of respiration.  CARDIOVASCULAR: S1, S2 normal. No murmurs, rubs, or gallops.  ABDOMEN: Soft, nontender, nondistended. Bowel sounds present. No organomegaly or mass.  EXTREMITIES: No pedal edema, cyanosis, or clubbing.  NEUROLOGIC: Cranial nerves II through XII are intact. Muscle strength 5/5 in all extremities. Sensation intact. Gait not  checked.  PSYCHIATRIC: The patient is alert and oriented x 3.  SKIN: No obvious rash, lesion, or ulcer.    LABORATORY PANEL:   CBC  Recent Labs Lab 10/01/16 0620  WBC 11.2*  HGB 11.7*  HCT 34.6*  PLT 190   ------------------------------------------------------------------------------------------------------------------  Chemistries   Recent Labs Lab 09/29/16 1841 09/30/16 0415  NA 137 138  K 4.3 3.5  CL 102 104  CO2 28 25  GLUCOSE 128* 137*  BUN 14 11  CREATININE 0.89 0.92  CALCIUM 9.4 9.1  AST 22  --   ALT <5*  --   ALKPHOS 64  --   BILITOT 0.7  --    ------------------------------------------------------------------------------------------------------------------  Cardiac Enzymes  Recent Labs Lab 09/30/16 1612  TROPONINI 0.10*   ------------------------------------------------------------------------------------------------------------------  RADIOLOGY:  Dg Chest 2 View  Result Date: 09/29/2016 CLINICAL DATA:  Pain following fall EXAM: CHEST  2 VIEW COMPARISON:  Chest radiograph Feb 13, 2016; chest CT Feb 13, 2016 FINDINGS: There is patchy opacity in the right base region concerning for early pneumonia. Lungs elsewhere are clear. Heart size and pulmonary vascularity are within normal limits. There is atherosclerotic calcification in the aorta. Patient is status post coronary artery bypass grafting. No bone lesions. IMPRESSION: Patchy opacity in right base concerning for early pneumonia. Lungs elsewhere clear. Stable cardiac silhouette. There is aortic atherosclerosis. Followup PA and lateral chest radiographs recommended in 3-4 weeks following trial of antibiotic therapy to ensure resolution and exclude underlying malignancy. Electronically Signed   By: Bretta Bang III M.D.   On: 09/29/2016 21:40    EKG:   Orders placed or performed during the hospital encounter of 09/29/16  . ED EKG  . ED EKG  . EKG 12-Lead  .  EKG 12-Lead  . EKG 12-Lead  . EKG  12-Lead    ASSESSMENT AND PLAN:   This is a 71 y.o. male with a history of Parkinson's disease, CAD, depression, GERD, anemia, depression, hyperlipidemia, hypertension, osteoarthritis, peripheral vascular disease now being admitted   1. Altered mental status secondary to community-acquired pneumonia- Slow clinical progress Continue   Levaquin, IV fluids follow blood and sputum cultures  expectorants, nebulizers, O2 therapy as needed  2. Elevated troponin secondary to demand ischemia 0.04-0.11 -0.Marland Kitchen.12 patient is asymptomatic   3. Anemia, chronic at baseline-repeat CBC in the morning  4. History of Parkinson's-continue Sinemet, amantadine, Requip, Ditropan  5. History of coronary artery disease Patient is asymptomatic  continue aspirin, Plavix  6. History of hyperlipidemia-continue Crestor  7. History of depression-continue Wellbutrin and Seroquel  8. History of dementia-continue Aricept  PT consult -skilled nursing facility   All the records are reviewed and case discussed with Care Management/Social Workerr. Management plans discussed with the patient, family and they are in agreement.  CODE STATUS: fc  TOTAL TIME TAKING CARE OF THIS PATIENT: 34  minutes.   POSSIBLE D/C IN 1-2  DAYS, DEPENDING ON CLINICAL CONDITION.  Note: This dictation was prepared with Dragon dictation along with smaller phrase technology. Any transcriptional errors that result from this process are unintentional.   Ramonita LabGouru, Wesleigh Markovic M.D on 10/01/2016 at 5:55 PM  Between 7am to 6pm - Pager - [phone] After 6pm go to [email or web address] - password [email or web address] [facility]  [facility]  Office  [phone]  CC: Primary care physician; Marguarite ArbourSPARKS,JEFFREY D, MD

## 2016-10-01 NOTE — Progress Notes (Signed)
Physical Therapy Treatment Patient Details Name: [name] MRN: [id number] DOB: 30-Sep-1945 Today's Date: 10/01/2016    History of Present Illness Pt admitted for community acquired pneumonia. Pt with upward trending troponin, however no complaints of chest pain and no cardio consult. Pt with history of Parkinson's disease, CAD, depression, GERD, anemia, and HTN. Pt now with complaints of AMS and fall at home.    PT Comments    Pt is making gradual progress towards goals secondary to lethargy. Pt able to participate in there-ex while supine, however due to alertness level, not safe to perform OOB mobility this date. Takes verbal/tactile cues to keep eyes open and participate. Confusion slightly worse this date. Will continue to progress as able.  Follow Up Recommendations  SNF     Equipment Recommendations       Recommendations for Other Services       Precautions / Restrictions Precautions Precautions: Fall Restrictions Weight Bearing Restrictions: No    Mobility  Bed Mobility               General bed mobility comments: not performed secondary to alertness level.   Transfers                    Ambulation/Gait                 Stairs            Wheelchair Mobility    Modified Rankin (Stroke Patients Only)       Balance                                    Cognition Arousal/Alertness: Lethargic Behavior During Therapy: Flat affect Overall Cognitive Status: Impaired/Different from baseline                      Exercises Other Exercises Other Exercises: Pt performed supine ther-ex including B LE SLRs, hip abd/add, hip add squeezes, supine bridging, and resisted elbow flexion/extension. All ther-ex performed x 12 reps with min assist with heavy cues for alertness level. Other Exercises: Assisted patient with repositioning on side with pillow placement between legs. Pt reports this helps to relieve pain.     General Comments        Pertinent Vitals/Pain Pain Assessment: Faces Faces Pain Scale: Hurts a little bit Pain Location: low back Pain Descriptors / Indicators: Discomfort Pain Intervention(s): Limited activity within patient's tolerance;Repositioned    Home Living                      Prior Function            PT Goals (current goals can now be found in the care plan section) Acute Rehab PT Goals Patient Stated Goal: to get stronger PT Goal Formulation: With patient Time For Goal Achievement: 10/14/16 Potential to Achieve Goals: Good Progress towards PT goals: Progressing toward goals    Frequency    Min 2X/week      PT Plan Current plan remains appropriate    Co-evaluation             End of Session   Activity Tolerance: Patient limited by lethargy Patient left: in bed;with bed alarm set     Time: 1455-1521 PT Time Calculation (min) (ACUTE ONLY): 26 min  Charges:  $Therapeutic Exercise: 8-22 mins $Therapeutic Activity: 8-22 mins  G Codes:      Matteus Mcnelly 10/01/2016, 4:35 PM Elizabeth Palau, PT, DPT [phone]

## 2016-10-01 NOTE — Care Management (Signed)
Admitted to this facility with the diagnosis of pneumonia. Lives with wife, Santina EvansCatherine x 52 years [phone]). Last seen Dr. Judithann SheenSparks 2-3 weeks ago. [facility] in the past. No skilled facility. No home oxygen. Cane and rolling walker in the home, if needed. [facility]. Larey SeatFell prior to this admission. 2 other falls last year. Fair appetite. 20 pound weight lost x 2 months. Takes care of all basic activities himself, sometimes wife helps with dressing. Doesn't drive.  Physical therapy evaluation completed. Recommending skilled nursing facility. Wife is in agreement with this plan Gwenette GreetBrenda S Devere Brem RN MSN CCM Care Management

## 2016-10-02 ENCOUNTER — Emergency Department: Payer: PPO

## 2016-10-02 ENCOUNTER — Emergency Department
Admission: EM | Admit: 2016-10-02 | Discharge: 2016-10-02 | Disposition: A | Discharge status: Home / Self Care | Payer: PPO | Attending: Emergency Medicine | Admitting: Emergency Medicine

## 2016-10-02 ENCOUNTER — Encounter: Payer: Self-pay | Admitting: Emergency Medicine

## 2016-10-02 DIAGNOSIS — E785 Hyperlipidemia, unspecified: Secondary | ICD-10-CM | POA: Diagnosis not present

## 2016-10-02 DIAGNOSIS — Z23 Encounter for immunization: Secondary | ICD-10-CM | POA: Insufficient documentation

## 2016-10-02 DIAGNOSIS — Z79899 Other long term (current) drug therapy: Secondary | ICD-10-CM | POA: Diagnosis not present

## 2016-10-02 DIAGNOSIS — Y939 Activity, unspecified: Secondary | ICD-10-CM | POA: Diagnosis not present

## 2016-10-02 DIAGNOSIS — W19XXXA Unspecified fall, initial encounter: Secondary | ICD-10-CM | POA: Diagnosis not present

## 2016-10-02 DIAGNOSIS — F329 Major depressive disorder, single episode, unspecified: Secondary | ICD-10-CM | POA: Diagnosis not present

## 2016-10-02 DIAGNOSIS — Z87891 Personal history of nicotine dependence: Secondary | ICD-10-CM | POA: Insufficient documentation

## 2016-10-02 DIAGNOSIS — R443 Hallucinations, unspecified: Secondary | ICD-10-CM | POA: Diagnosis not present

## 2016-10-02 DIAGNOSIS — S0990XA Unspecified injury of head, initial encounter: Secondary | ICD-10-CM | POA: Diagnosis not present

## 2016-10-02 DIAGNOSIS — Z7982 Long term (current) use of aspirin: Secondary | ICD-10-CM | POA: Diagnosis not present

## 2016-10-02 DIAGNOSIS — I10 Essential (primary) hypertension: Secondary | ICD-10-CM | POA: Diagnosis not present

## 2016-10-02 DIAGNOSIS — S59902A Unspecified injury of left elbow, initial encounter: Secondary | ICD-10-CM | POA: Diagnosis not present

## 2016-10-02 DIAGNOSIS — S199XXA Unspecified injury of neck, initial encounter: Secondary | ICD-10-CM | POA: Diagnosis not present

## 2016-10-02 DIAGNOSIS — M6281 Muscle weakness (generalized): Secondary | ICD-10-CM | POA: Diagnosis not present

## 2016-10-02 DIAGNOSIS — Z7409 Other reduced mobility: Secondary | ICD-10-CM | POA: Diagnosis not present

## 2016-10-02 DIAGNOSIS — G20 Parkinson's disease: Secondary | ICD-10-CM | POA: Diagnosis not present

## 2016-10-02 DIAGNOSIS — I2510 Atherosclerotic heart disease of native coronary artery without angina pectoris: Secondary | ICD-10-CM | POA: Insufficient documentation

## 2016-10-02 DIAGNOSIS — R41841 Cognitive communication deficit: Secondary | ICD-10-CM | POA: Diagnosis not present

## 2016-10-02 DIAGNOSIS — I252 Old myocardial infarction: Secondary | ICD-10-CM | POA: Diagnosis not present

## 2016-10-02 DIAGNOSIS — Y92239 Unspecified place in hospital as the place of occurrence of the external cause: Secondary | ICD-10-CM | POA: Diagnosis not present

## 2016-10-02 DIAGNOSIS — M542 Cervicalgia: Secondary | ICD-10-CM | POA: Diagnosis not present

## 2016-10-02 DIAGNOSIS — S50812A Abrasion of left forearm, initial encounter: Secondary | ICD-10-CM | POA: Diagnosis not present

## 2016-10-02 DIAGNOSIS — R6889 Other general symptoms and signs: Secondary | ICD-10-CM | POA: Diagnosis not present

## 2016-10-02 DIAGNOSIS — I739 Peripheral vascular disease, unspecified: Secondary | ICD-10-CM | POA: Diagnosis not present

## 2016-10-02 DIAGNOSIS — M25552 Pain in left hip: Secondary | ICD-10-CM | POA: Diagnosis not present

## 2016-10-02 DIAGNOSIS — Y999 Unspecified external cause status: Secondary | ICD-10-CM | POA: Diagnosis not present

## 2016-10-02 DIAGNOSIS — J189 Pneumonia, unspecified organism: Secondary | ICD-10-CM | POA: Diagnosis not present

## 2016-10-02 DIAGNOSIS — W1800XA Striking against unspecified object with subsequent fall, initial encounter: Secondary | ICD-10-CM | POA: Diagnosis not present

## 2016-10-02 DIAGNOSIS — M25512 Pain in left shoulder: Secondary | ICD-10-CM | POA: Diagnosis not present

## 2016-10-02 DIAGNOSIS — Z9181 History of falling: Secondary | ICD-10-CM | POA: Diagnosis not present

## 2016-10-02 DIAGNOSIS — Z7401 Bed confinement status: Secondary | ICD-10-CM | POA: Diagnosis not present

## 2016-10-02 DIAGNOSIS — E440 Moderate protein-calorie malnutrition: Secondary | ICD-10-CM | POA: Diagnosis not present

## 2016-10-02 DIAGNOSIS — I25119 Atherosclerotic heart disease of native coronary artery with unspecified angina pectoris: Secondary | ICD-10-CM | POA: Diagnosis not present

## 2016-10-02 DIAGNOSIS — Z7902 Long term (current) use of antithrombotics/antiplatelets: Secondary | ICD-10-CM | POA: Diagnosis not present

## 2016-10-02 DIAGNOSIS — M25522 Pain in left elbow: Secondary | ICD-10-CM | POA: Diagnosis not present

## 2016-10-02 DIAGNOSIS — F0280 Dementia in other diseases classified elsewhere without behavioral disturbance: Secondary | ICD-10-CM | POA: Diagnosis not present

## 2016-10-02 DIAGNOSIS — N3281 Overactive bladder: Secondary | ICD-10-CM | POA: Diagnosis not present

## 2016-10-02 DIAGNOSIS — K219 Gastro-esophageal reflux disease without esophagitis: Secondary | ICD-10-CM | POA: Diagnosis not present

## 2016-10-02 MED ORDER — GUAIFENESIN ER 600 MG PO TB12: 600.0000 mg | ORAL_TABLET | Freq: Two times a day (BID) | ORAL | 0 refills | Status: AC

## 2016-10-02 MED ORDER — TETANUS-DIPHTH-ACELL PERTUSSIS 5-2.5-18.5 LF-MCG/0.5 IM SUSP
0.5000 mL | Freq: Once | INTRAMUSCULAR | Status: AC
  Administered 2016-10-02: 0.5 mL via INTRAMUSCULAR
  Filled 2016-10-02: qty 0.5

## 2016-10-02 MED ORDER — ACETAMINOPHEN 325 MG PO TABS: 650.0000 mg | ORAL_TABLET | Freq: Four times a day (QID) | ORAL | 0 refills | Status: AC | PRN

## 2016-10-02 MED ORDER — NAPROXEN 500 MG PO TABS: 500.0000 mg | ORAL_TABLET | Freq: Two times a day (BID) | ORAL | 0 refills | Status: AC

## 2016-10-02 MED ORDER — IPRATROPIUM-ALBUTEROL 0.5-2.5 (3) MG/3ML IN SOLN: 3.0000 mL | Freq: Four times a day (QID) | RESPIRATORY_TRACT | 0 refills | Status: AC | PRN

## 2016-10-02 MED ORDER — LEVOFLOXACIN 750 MG PO TABS: 750.0000 mg | ORAL_TABLET | Freq: Every day | ORAL | 0 refills | Status: AC

## 2016-10-02 MED ORDER — DEXTROMETHORPHAN POLISTIREX ER 30 MG/5ML PO SUER: 30.0000 mg | Freq: Two times a day (BID) | ORAL | 0 refills | Status: AC

## 2016-10-02 NOTE — Discharge Instructions (Signed)
Your CT scan of the head and neck were unremarkable.  Your xrays of the left hip and elbow did not reveal any new injuries. Follow up with primary care this week. [facility].

## 2016-10-02 NOTE — Discharge Summary (Signed)
[facility] [place] at [facility]   PATIENT NAME: [name]    MR#:  [id number]  DATE OF BIRTH:  1946/06/07  DATE OF ADMISSION:  09/29/2016 ADMITTING PHYSICIAN: Tonye Royalty, DO  DATE OF DISCHARGE: 10/02/2016  PRIMARY CARE PHYSICIAN: SPARKS,JEFFREY D, MD    ADMISSION DIAGNOSIS:  Confusion [R41.0] Community acquired pneumonia of right lower lobe of lung (HCC) [J18.1]  DISCHARGE DIAGNOSIS:  Active Problems:   Community acquired pneumonia   Malnutrition of moderate degree   SECONDARY DIAGNOSIS:   Past Medical History:  Diagnosis Date  . Angina pectoris (HCC)   . Arthritis    back  . Depression   . Heart disease   . Parkinson disease [facility])     HOSPITAL COURSE:   This is a 71 y.o.malewith a history of Parkinson's disease, CAD, depression, GERD, anemia, depression, hyperlipidemia, hypertension, osteoarthritis, peripheral vascular diseasenow being admitted   1. Altered mental status secondary to community-acquired pneumonia- Slow clinical progress Continue   Levaquin, IV fluids follow blood and sputum cultures  expectorants, nebulizers, O2 therapy as needed  as per wife , he had episodes of confusion at home also with his dementia.  Still not completely at baseline- likely due to infection and change in familiar place.   No fever in last 36 hrs, WBCs coming down, bl cx negative.  2. Elevated troponin secondary to demand ischemia 0.04-0.11 -0.Marland Kitchen12 patient is asymptomatic  3. Anemia, chronic at baseline-repeat CBC in the morning  4. History of Parkinson's-continue Sinemet, amantadine, Requip, Ditropan  5. History of coronary artery disease Patient is asymptomatic  continue aspirin, Plavix  6. History of hyperlipidemia-continue Crestor  7. History of depression-continue Wellbutrin and Seroquel  8. History of dementia-continue Aricept  9. Fall- due to weakness in hospital- PT suggested SNF- arranged.  DISCHARGE  CONDITIONS:   Stable.  CONSULTS OBTAINED:    DRUG ALLERGIES:  No Known Allergies  DISCHARGE MEDICATIONS:   Current Discharge Medication List    START taking these medications   Details  dextromethorphan (DELSYM) 30 MG/5ML liquid Take 5 mLs (30 mg total) by mouth 2 (two) times daily. Qty: 89 mL, Refills: 0    guaiFENesin (MUCINEX) 600 MG 12 hr tablet Take 1 tablet (600 mg total) by mouth 2 (two) times daily. Qty: 10 tablet, Refills: 0    ipratropium-albuterol (DUONEB) 0.5-2.5 (3) MG/3ML SOLN Take 3 mLs by nebulization every 6 (six) hours as needed (for wheezing.). Qty: 360 mL, Refills: 0    levofloxacin (LEVAQUIN) 750 MG tablet Take 1 tablet (750 mg total) by mouth daily. Qty: 4 tablet, Refills: 0      CONTINUE these medications which have NOT CHANGED   Details  aspirin 81 MG tablet Take 81 mg by mouth daily.    buPROPion (WELLBUTRIN) 75 MG tablet Take 75 mg by mouth once. 9am    carbidopa-levodopa (SINEMET IR) 25-100 MG per tablet Take 1-1.5 tablets by mouth 5 (five) times daily. 1 1/2@9am ; 1 1/2@11am , 2pm, 4pm; 1 @5 :30pm    Carbidopa-Levodopa ER (SINEMET CR) 25-100 MG tablet controlled release Take 1 tablet by mouth at bedtime.    clopidogrel (PLAVIX) 75 MG tablet Take 75 mg by mouth daily. 12pm    donepezil (ARICEPT) 10 MG tablet Take 10 mg by mouth at bedtime.    midodrine (PROAMATINE) 2.5 MG tablet Take 2.5 mg by mouth 3 (three) times daily as needed (blood pressure below 100/80).    nitroGLYCERIN (NITROSTAT) 0.4 MG SL tablet Place 0.4 mg under  the tongue every 5 (five) minutes as needed for chest pain.    omeprazole (PRILOSEC) 40 MG capsule Take 40 mg by mouth daily. 11pm    oxybutynin (DITROPAN) 5 MG tablet Take 5 mg by mouth 3 (three) times daily.    Pimavanserin Tartrate 17 MG TABS Take 34 mg by mouth daily.    QUEtiapine (SEROQUEL) 50 MG tablet Take 100 mg by mouth at bedtime.     rOPINIRole (REQUIP) 1 MG tablet Take 1 mg by mouth 2 (two) times daily. 1  @9am , 2pm    rosuvastatin (CRESTOR) 5 MG tablet Take 5 mg by mouth at bedtime.    vitamin B-12 (CYANOCOBALAMIN) 1000 MCG tablet Take 1,000 mcg by mouth daily.    amantadine (SYMMETREL) 50 MG/5ML solution Take 100 mg by mouth 2 (two) times daily. 10ml @@ 9am, 11pm         DISCHARGE INSTRUCTIONS:    Follow with PMD in 1-2 weeks.  If you experience worsening of your admission symptoms, develop shortness of breath, life threatening emergency, suicidal or homicidal thoughts you must seek medical attention immediately by calling 911 or calling your MD immediately  if symptoms less severe.  You Must read complete instructions/literature along with all the possible adverse reactions/side effects for all the Medicines you take and that have been prescribed to you. Take any new Medicines after you have completely understood and accept all the possible adverse reactions/side effects.   Please note  You were cared for by a hospitalist during your hospital stay. If you have any questions about your discharge medications or the care you received while you were in the hospital after you are discharged, you can call the unit and asked to speak with the hospitalist on call if the hospitalist that took care of you is not available. Once you are discharged, your primary care physician will handle any further medical issues. Please note that NO REFILLS for any discharge medications will be authorized once you are discharged, as it is imperative that you return to your primary care physician (or establish a relationship with a primary care physician if you do not have one) for your aftercare needs so that they can reassess your need for medications and monitor your lab values.    Today   CHIEF COMPLAINT:   Chief Complaint  Patient presents with  . Fall  . Altered Mental Status    HISTORY OF PRESENT ILLNESS:  [name]  is a 71 y.o. male with a known history of Parkinson's disease, CAD, depression,  GERD, anemia, depression, hyperlipidemia, hypertension, osteoarthritis, peripheral vascular disease presents to the emergency department for evaluation of altered mental status.  Patient was in a usual state of health until 2 days ago when he developed worsening confusion. Patient's wife thought it may be related to an adjustment to his Parkinson's medications which has been tapering down because of confusion however today the patient fell hitting his elbow on a table. Patient's wife indicates that he is occasionally confused secondary to Parkinson's and mild dementia but today's symptoms or dramatically different than his usual. She also reports that he has been suffering from a head cold including nasal and sinus congestion with a dry nonproductive cough. She denies any fever..   Otherwise there has been no change in status. Patient has been taking medication as prescribed and there has been no recent change in medication or diet.  There has been no recent illness, hospitalizations, travel or sick contacts.  On questioning, the patient is unable to answer questions appropriately secondary to altered mental status.  Patient's wife denies fevers/chills, dizziness, chest pain, shortness of breath, N/V/C/D, abdominal pain, dysuria/frequency.   VITAL SIGNS:  Blood pressure (!) 144/81, pulse 82, temperature 98 F (36.7 C), temperature source Oral, resp. rate (!) 22, height 6' (1.829 m), weight 85.9 kg (189 lb 4.8 oz), SpO2 93 %.  I/O:   Intake/Output Summary (Last 24 hours) at 10/02/16 1011 Last data filed at 10/02/16 0256  Gross per 24 hour  Intake              360 ml  Output             1000 ml  Net             -640 ml    PHYSICAL EXAMINATION:   GENERAL:  71 y.o.-year-old patient lying in the bed with no acute distress.  EYES: Pupils equal, round, reactive to light and accommodation. No scleral icterus. Extraocular muscles intact.  HEENT: Head atraumatic, normocephalic. Oropharynx and  nasopharynx clear.  NECK:  Supple, no jugular venous distention. No thyroid enlargement, no tenderness.  LUNGS: Moderate breath sounds bilaterally, no wheezing, rales,rhonchi or crepitation. No use of accessory muscles of respiration.  CARDIOVASCULAR: S1, S2 normal. No murmurs, rubs, or gallops.  ABDOMEN: Soft, nontender, nondistended. Bowel sounds present. No organomegaly or mass.  EXTREMITIES: No pedal edema, cyanosis, or clubbing.  NEUROLOGIC: Cranial nerves II through XII are intact. Muscle strength 4/5 in all extremities. Sensation intact. Gait not checked.  PSYCHIATRIC: The patient is alert and oriented x 2.  SKIN: No obvious rash, lesion, or ulcer.    DATA REVIEW:   CBC  Recent Labs Lab 10/01/16 0620  WBC 11.2*  HGB 11.7*  HCT 34.6*  PLT 190    Chemistries   Recent Labs Lab 09/29/16 1841 09/30/16 0415  NA 137 138  K 4.3 3.5  CL 102 104  CO2 28 25  GLUCOSE 128* 137*  BUN 14 11  CREATININE 0.89 0.92  CALCIUM 9.4 9.1  AST 22  --   ALT <5*  --   ALKPHOS 64  --   BILITOT 0.7  --     Cardiac Enzymes  Recent Labs Lab 09/30/16 1612  TROPONINI 0.10*    Microbiology Results  Results for orders placed or performed during the hospital encounter of 09/29/16  Rapid Influenza A&B Antigens ([facility] only)     Status: None   Collection Time: 09/29/16  9:11 PM  Result Value Ref Range Status   Influenza A ([facility]) NEGATIVE NEGATIVE Final   Influenza B ([facility]) NEGATIVE NEGATIVE Final  Blood culture (routine x 2)     Status: None (Preliminary result)   Collection Time: 09/29/16  9:48 PM  Result Value Ref Range Status   Specimen Description BLOOD RIGHT AC  Final   Special Requests BOTTLES DRAWN AEROBIC AND ANAEROBIC ANA5CC AER7CC  Final   Culture NO GROWTH 3 DAYS  Final   Report Status PENDING  Incomplete  Blood culture (routine x 2)     Status: None (Preliminary result)   Collection Time: 09/29/16  9:48 PM  Result Value Ref Range Status   Specimen Description BLOOD  RIGHT FOREARM  Final   Special Requests BOTTLES DRAWN AEROBIC AND ANAEROBIC 6CC  Final   Culture NO GROWTH 3 DAYS  Final   Report Status PENDING  Incomplete    RADIOLOGY:  No results found.  EKG:   Orders placed  or performed during the hospital encounter of 09/29/16  . ED EKG  . ED EKG  . EKG 12-Lead  . EKG 12-Lead  . EKG 12-Lead  . EKG 12-Lead      Management plans discussed with the patient, family and they are in agreement.  CODE STATUS:     Code Status Orders        Start     Ordered   09/30/16 0220  Full code  Continuous     09/30/16 0219    Code Status History    Date Active Date Inactive Code Status Order ID Comments User Context   This patient has a current code status but no historical code status.    Advance Directive Documentation   Flowsheet Row Most Recent Value  Type of Advance Directive  Healthcare Power of Attorney  Pre-existing out of facility DNR order (yellow form or pink MOST form)  No data  "MOST" Form in Place?  No data      TOTAL TIME TAKING CARE OF THIS PATIENT: 35 minutes.  Discussed with pt's wife in room.  Altamese Dilling M.D on 10/02/2016 at 10:11 AM  Between 7am to 6pm - Pager - [phone]  After 6pm go to [email or web address] - password [email or web address]  [facility]  Office  [phone]  CC: Primary care physician; Marguarite Arbour, MD   Note: This dictation was prepared with Dragon dictation along with smaller phrase technology. Any transcriptional errors that result from this process are unintentional.

## 2016-10-02 NOTE — ED Notes (Signed)
Report called to [place], Charity fundraiserN at [facility]. Report to EMS.

## 2016-10-02 NOTE — ED Triage Notes (Signed)
Per ems, pt d/c'ed today from the hospital and went [facility]; had a fall hitting L elbow, hip and head

## 2016-10-02 NOTE — Clinical Social Work Note (Signed)
[facility] non[facility]emergent EMS due to AMS and Parkinson's. The family is concerned that the patient may attempt to refuse to leave. The CSW provided emotional support and assured the family that the patient is not in a state to make that decision due to AMS. The family is in full agreement with the dc plan. The facility is aware of dc. The CSW will con't to follow pending additional dc needs or needs for redirection of the patient.  Gary PonderKaren Martha Neisha Hinger, MSW, LCSW-A [phone]

## 2016-10-02 NOTE — Progress Notes (Signed)
[facility]. Writer gave report to accepting RN at [facility]. Pt being discharged on room air, IV removed, no distress noted. Otilio JeffersonMadelyn S Fenton, RN

## 2016-10-02 NOTE — Progress Notes (Signed)
Patients bed alarm sounded, patient found on the floor on his knees near bed, witnessed by patients wife, no injury noted, no complaints of pain, vital signs sable, MD notified, [facility] notified, Safety Portal completed.

## 2016-10-02 NOTE — ED Provider Notes (Signed)
[facility] Emergency Department Provider Note  ____________________________________________  Time seen: Approximately 7:21 PM  I have reviewed the triage vital signs and the nursing notes.   HISTORY  Chief Complaint Fall Level 5 caveat:  Portions of the history and physical were unable to be obtained due to the patient's altered mental status from chronic dementia    HPI [name] is a 71 y.o. male sent to the ED for evaluation after a mechanical fall at [facility]. Patient was recently hospitalized for pneumonia, had a fall on the day of hospitalization. Also had a fall this morning prior to discharge from the hospital and then when he arrived at [facility] fell again. He is disoriented at baseline. He complains of pain in the left side of his neck and back of his head from the fall. Also complains of pain in left elbow and left hip.     Past Medical History:  Diagnosis Date  . Angina pectoris (HCC)   . Arthritis    back  . Depression   . Heart disease   . Parkinson disease [facility])      Patient Active Problem List   Diagnosis Date Noted  . Malnutrition of moderate degree 09/30/2016  . Community acquired pneumonia 09/29/2016  . Arteriosclerosis of coronary artery 02/11/2016  . Back pain, chronic 02/11/2016  . Feeling bilious 02/11/2016  . Fatigue 02/11/2016  . Acid reflux 02/11/2016  . HLD (hyperlipidemia) 02/11/2016  . BP (high blood pressure) 02/11/2016  . Arthritis, degenerative 02/11/2016  . Peripheral vascular disease (HCC) 02/11/2016  . Parkinson's disease (HCC) 12/15/2015  . Neuritis or radiculitis due to rupture of lumbar intervertebral disc 03/26/2015  . Absolute anemia 04/18/2014  . Hallucination 06/12/2013  . CAD, multiple vessel 10/30/2012  . Cataract 10/30/2012  . Myocardial infarction 11/26/2002     Past Surgical History:  Procedure Laterality Date  . APPENDECTOMY    . CARDIAC SURGERY     stents on 12/13/02; 09/09/08;  12/16/10  . EYE SURGERY     cataracts  . KNEE ARTHROSCOPY Bilateral   . SHOULDER SURGERY Right    benign mass removed  . TONSILLECTOMY       Prior to Admission medications   Medication Sig Start Date End Date Taking? Authorizing Provider  acetaminophen (TYLENOL) 325 MG tablet Take 2 tablets (650 mg total) by mouth every 6 (six) hours as needed. 10/02/16   Sharman Cheek, MD  amantadine (SYMMETREL) 50 MG/5ML solution Take 100 mg by mouth 2 (two) times daily. 10ml @@ 9am, 11pm    Historical Provider, MD  aspirin 81 MG tablet Take 81 mg by mouth daily.    Historical Provider, MD  buPROPion (WELLBUTRIN) 75 MG tablet Take 75 mg by mouth once. 9am    Historical Provider, MD  carbidopa-levodopa (SINEMET IR) 25-100 MG per tablet Take 1-1.5 tablets by mouth 5 (five) times daily. 1 1/2@9am ; 1 1/2@11am , 2pm, 4pm; 1 @5 :30pm    Historical Provider, MD  Carbidopa-Levodopa ER (SINEMET CR) 25-100 MG tablet controlled release Take 1 tablet by mouth at bedtime.    Historical Provider, MD  clopidogrel (PLAVIX) 75 MG tablet Take 75 mg by mouth daily. 12pm    Historical Provider, MD  dextromethorphan (DELSYM) 30 MG/5ML liquid Take 5 mLs (30 mg total) by mouth 2 (two) times daily. 10/02/16   Altamese Dilling, MD  donepezil (ARICEPT) 10 MG tablet Take 10 mg by mouth at bedtime.    Historical Provider, MD  guaiFENesin (MUCINEX) 600 MG 12  hr tablet Take 1 tablet (600 mg total) by mouth 2 (two) times daily. 10/02/16   Altamese Dilling, MD  ipratropium-albuterol (DUONEB) 0.5-2.5 (3) MG/3ML SOLN Take 3 mLs by nebulization every 6 (six) hours as needed (for wheezing.). 10/02/16 10/05/16  Altamese Dilling, MD  levofloxacin (LEVAQUIN) 750 MG tablet Take 1 tablet (750 mg total) by mouth daily. 10/02/16   Altamese Dilling, MD  midodrine (PROAMATINE) 2.5 MG tablet Take 2.5 mg by mouth 3 (three) times daily as needed (blood pressure below 100/80).    Historical Provider, MD  naproxen (NAPROSYN) 500 MG tablet Take 1  tablet (500 mg total) by mouth 2 (two) times daily with a meal. 10/02/16   Sharman Cheek, MD  nitroGLYCERIN (NITROSTAT) 0.4 MG SL tablet Place 0.4 mg under the tongue every 5 (five) minutes as needed for chest pain.    Historical Provider, MD  omeprazole (PRILOSEC) 40 MG capsule Take 40 mg by mouth daily. 11pm    Historical Provider, MD  oxybutynin (DITROPAN) 5 MG tablet Take 5 mg by mouth 3 (three) times daily.    Historical Provider, MD  Pimavanserin Tartrate 17 MG TABS Take 34 mg by mouth daily.    Historical Provider, MD  QUEtiapine (SEROQUEL) 50 MG tablet Take 100 mg by mouth at bedtime.     Historical Provider, MD  rOPINIRole (REQUIP) 1 MG tablet Take 1 mg by mouth 2 (two) times daily. 1 @9am , 2pm    Historical Provider, MD  rosuvastatin (CRESTOR) 5 MG tablet Take 5 mg by mouth at bedtime.    Historical Provider, MD  vitamin B-12 (CYANOCOBALAMIN) 1000 MCG tablet Take 1,000 mcg by mouth daily.    Historical Provider, MD     Allergies Patient has no known allergies.   Family History  Problem Relation Age of Onset  . Heart disease Father     Social History Social History  Substance Use Topics  . Smoking status: Former Smoker    Packs/day: 1.50    Years: 25.00    Types: Cigarettes, Cigars    Quit date: 09/29/1985  . Smokeless tobacco: Former Neurosurgeon    Quit date: 09/29/1985  . Alcohol use 4.2 oz/week    7 Glasses of wine per week    Review of Systems  C Cardiovascular:   No chest pain. Respiratory:   No dyspnea or cough. Gastrointestinal:   Negative for abdominal pain,    Musculoskeletal:   Neck left elbow and left hip pain as above Neurological:   Negative for headaches 10-point ROS otherwise negative.  ____________________________________________   PHYSICAL EXAM:  VITAL SIGNS: ED Triage Vitals  Enc Vitals Group     BP 10/02/16 1701 (!) 139/93     Pulse Rate 10/02/16 1701 71     Resp 10/02/16 1800 18     Temp 10/02/16 1701 99.1 F (37.3 C)     Temp Source  10/02/16 1701 Oral     SpO2 10/02/16 1659 94 %     Weight 10/02/16 1702 189 lb (85.7 kg)     Height 10/02/16 1702 6' (1.829 m)     Head Circumference --      Peak Flow --      Pain Score 10/02/16 1702 4     Pain Loc --      Pain Edu? --      Excl. in GC? --     Vital signs reviewed, nursing assessments reviewed.   Constitutional:   Alert and orientedTo self. Well appearing and in  no distress. Eyes:   No scleral icterus. No conjunctival pallor. PERRL. EOMI.  No nystagmus. ENT   Head:   Normocephalic and atraumatic.   Nose:   No congestion/rhinnorhea. No septal hematoma   Mouth/Throat:   MMM, no pharyngeal erythema. No peritonsillar mass.    Neck:   No stridor. No SubQ emphysema. No meningismus. No midline spinal tenderness. Full range of motion. Hematological/Lymphatic/Immunilogical:   No cervical lymphadenopathy. Cardiovascular:   RRR. Symmetric bilateral radial and DP pulses.  No murmurs.  Respiratory:   Normal respiratory effort without tachypnea nor retractions. Breath sounds are clear and equal bilaterally. No wheezes/rales/rhonchi. Gastrointestinal:   Soft and nontender. Non distended. There is no CVA tenderness.  No rebound, rigidity, or guarding. Genitourinary:   deferred Musculoskeletal:   Tenderness at the left elbow lateral epicondyle and on the left proximal hip anteriorly. Full range of motion in all joints. No crepitus or bony instability palpated. Neurologic:   Seems to answer questions appropriately but also speaks incoherently..  CN 2-10 normal. Motor grossly intact. No gross focal neurologic deficits are appreciated.  Skin:    Skin is warm, dry with abrasion over the left forearm. Arrives with a clear dressing in place. No rash noted.  No petechiae, purpura, or bullae.  ____________________________________________    LABS (pertinent positives/negatives) (all labs ordered are listed, but only abnormal results are displayed) Labs Reviewed - No data  to display ____________________________________________   EKG  Interpreted by me Sinus rhythm rate of 74, normal axis and intervals. Left bundle-branch block. No acute ischemic changes.  ____________________________________________    RADIOLOGY  CT head unremarkable CT cervical spine unremarkable X-ray left elbow unremarkable X-ray left hip and pelvis unremarkable  ____________________________________________   PROCEDURES Procedures  ____________________________________________   INITIAL IMPRESSION / ASSESSMENT AND PLAN / ED COURSE  Pertinent labs & imaging results that were available during my care of the patient were reviewed by me and considered in my medical decision making (see chart for details).  Patient at neurologic baseline, complains of musculoskeletal pain in multiple areas after a fall. Imaging workup negative. Last tetanus shot was about 6 years ago so this was updated in the emergency department today. [facility] and follow up with primary care.   Clinical Course    ____________________________________________   FINAL CLINICAL IMPRESSION(S) / ED DIAGNOSES  Final diagnoses:  Fall, initial encounter  Minor head injury, initial encounter  Left hip pain  Abrasion of left forearm, initial encounter      New Prescriptions   ACETAMINOPHEN (TYLENOL) 325 MG TABLET    Take 2 tablets (650 mg total) by mouth every 6 (six) hours as needed.   NAPROXEN (NAPROSYN) 500 MG TABLET    Take 1 tablet (500 mg total) by mouth 2 (two) times daily with a meal.     Portions of this note were generated with dragon dictation software. Dictation errors may occur despite best attempts at proofreading.    Sharman CheekPhillip Nadalie Laughner, MD 10/02/16 1940

## 2016-10-04 DIAGNOSIS — G20 Parkinson's disease: Secondary | ICD-10-CM | POA: Diagnosis not present

## 2016-10-04 DIAGNOSIS — J189 Pneumonia, unspecified organism: Secondary | ICD-10-CM | POA: Diagnosis not present

## 2016-10-04 DIAGNOSIS — F0390 Unspecified dementia without behavioral disturbance: Secondary | ICD-10-CM | POA: Diagnosis not present

## 2016-10-04 DIAGNOSIS — F329 Major depressive disorder, single episode, unspecified: Secondary | ICD-10-CM | POA: Diagnosis not present

## 2016-10-04 DIAGNOSIS — G9340 Encephalopathy, unspecified: Secondary | ICD-10-CM | POA: Diagnosis not present

## 2016-10-04 LAB — CULTURE, BLOOD (ROUTINE X 2)
CULTURE: NO GROWTH
Culture: NO GROWTH

## 2016-10-05 DIAGNOSIS — R531 Weakness: Secondary | ICD-10-CM | POA: Diagnosis not present

## 2016-10-05 DIAGNOSIS — R05 Cough: Secondary | ICD-10-CM | POA: Diagnosis not present

## 2016-10-05 DIAGNOSIS — G20 Parkinson's disease: Secondary | ICD-10-CM | POA: Diagnosis not present

## 2016-10-05 LAB — URINALYSIS, COMPLETE (UACMP) WITH MICROSCOPIC
Bacteria, UA: NONE SEEN
Bilirubin Urine: NEGATIVE
GLUCOSE, UA: NEGATIVE mg/dL
HGB URINE DIPSTICK: NEGATIVE
Ketones, ur: 5 mg/dL — AB
Leukocytes, UA: NEGATIVE
NITRITE: NEGATIVE
PROTEIN: 30 mg/dL — AB
Specific Gravity, Urine: 1.029 (ref 1.005–1.030)
Squamous Epithelial / LPF: NONE SEEN
pH: 6 (ref 5.0–8.0)

## 2016-10-07 ENCOUNTER — Encounter: Payer: Self-pay | Admitting: Gerontology

## 2016-10-07 ENCOUNTER — Non-Acute Institutional Stay (SKILLED_NURSING_FACILITY): Payer: PPO | Admitting: Gerontology

## 2016-10-07 DIAGNOSIS — R05 Cough: Secondary | ICD-10-CM | POA: Diagnosis not present

## 2016-10-07 DIAGNOSIS — S61411S Laceration without foreign body of right hand, sequela: Secondary | ICD-10-CM

## 2016-10-07 DIAGNOSIS — G20 Parkinson's disease: Secondary | ICD-10-CM | POA: Diagnosis not present

## 2016-10-07 DIAGNOSIS — J181 Lobar pneumonia, unspecified organism: Secondary | ICD-10-CM

## 2016-10-07 DIAGNOSIS — E440 Moderate protein-calorie malnutrition: Secondary | ICD-10-CM | POA: Diagnosis not present

## 2016-10-07 DIAGNOSIS — J189 Pneumonia, unspecified organism: Secondary | ICD-10-CM

## 2016-10-07 DIAGNOSIS — R410 Disorientation, unspecified: Secondary | ICD-10-CM

## 2016-10-07 LAB — COMPREHENSIVE METABOLIC PANEL
ALBUMIN: 3.3 g/dL — AB (ref 3.5–5.0)
ALK PHOS: 102 U/L (ref 38–126)
AST: 26 U/L (ref 15–41)
Anion gap: 9 (ref 5–15)
BUN: 21 mg/dL — ABNORMAL HIGH (ref 6–20)
CO2: 27 mmol/L (ref 22–32)
CREATININE: 0.83 mg/dL (ref 0.61–1.24)
Calcium: 9 mg/dL (ref 8.9–10.3)
Chloride: 105 mmol/L (ref 101–111)
GFR calc Af Amer: >60 mL/min (ref >60)
GFR calc non Af Amer: >60 mL/min (ref >60)
GLUCOSE: 117 mg/dL — AB (ref 65–99)
Potassium: 3.3 mmol/L — ABNORMAL LOW (ref 3.5–5.1)
SODIUM: 141 mmol/L (ref 135–145)
Total Bilirubin: 0.7 mg/dL (ref 0.3–1.2)
Total Protein: 7.2 g/dL (ref 6.5–8.1)

## 2016-10-07 LAB — URINE CULTURE: Culture: NO GROWTH

## 2016-10-07 LAB — VITAMIN B12: Vitamin B-12: 3652 pg/mL — ABNORMAL HIGH (ref 180–914)

## 2016-10-07 LAB — MAGNESIUM: Magnesium: 2.4 mg/dL (ref 1.7–2.4)

## 2016-10-07 LAB — TSH: TSH: 1.436 u[IU]/mL (ref 0.350–4.500)

## 2016-10-07 NOTE — Progress Notes (Signed)
Location:      Place of Service:  SNF (31) Provider:  Toni Arthurs, NP-C  SPARKS,Gary D, MD  Patient Care Team: Idelle Crouch, MD as PCP - General (Internal Medicine)  Extended Emergency Contact Information Primary Emergency Contact: Berent,[name] Address: [address]          [place], [place] [address] Johnnette Litter of [place] Phone: [phone] Relation: Spouse Secondary Emergency Contact: [name], [place] [address] Johnnette Litter of [place] Mobile Phone: [phone] Relation: Daughter  Code Status:  full Goals of care: Advanced Directive information Advanced Directives 10/02/2016  Does Patient Have [name]? Yes  Type of Advance Directive -  Does patient want to make changes to medical advance directive? -  Copy of [place] in Chart? Yes     Chief Complaint  Patient presents with  . Acute Visit    HPI:  Pt is [name] 71 y.o. male seen today for an acute visit for multiple concerns. Wife wanted to have [place] assess the status of the PNA. Pt denies cough or fever. Wife denies choking with eating/drinking. Afebrile. Though pt does have crackles in BLL. Will obtain fu cxr. Wife also reports he is looking better today and is conversant when he was not so yesterday. Wife reports he has had increased confusion/ delirium since admission. He has had multiple falls at home and three falls since admission, with confusion. We discussed the trajectory of delirium in dementia pts. Wife verbalized she understood. We discussed adding low dose seroquel in the morning to his regimen. Wife was agreeable and will let me know if pt becomes too sedate, etc with increased dose. Wife reports he is not eating or drinking but that he does not like the Ensures. We discussed trying the Ensure clear and see if he likes those. We discussed hydration status and importance of protein and calories for healing. She understood. Wife also reports he "had  started getting thin at home." Wife agreeable to appetite stimulant after the weekend if appetite has not improved with increased seroquel. Pt has multiple skin tears. Left elbow happened at home. Right wrist tear happened last night. He fell in his room and crawled into the hallway. This is [name] large skin tear with [name] non-viable skin flap. Covered now with tegaderm, has ++ sangenous drainage. No redness or warmth. No signs of infection at this point. Will make medication adjustments and dietary adjustments, labs and xrays today. Will monitor over the weekend. If no improvement seen, will discuss with wife about [name]. Wife/pt denies n/v/[name]/c/cp/sob/ha/abd pain/dizziness/ pain. VSS. No other complaints   Past Medical History:  Diagnosis Date  . Angina pectoris ([facility])   . Arthritis    back  . Depression   . Heart disease   . Parkinson disease [facility])    Past Surgical History:  Procedure Laterality Date  . APPENDECTOMY    . CARDIAC SURGERY     stents on 12/13/02; 09/09/08; 12/16/10  . EYE SURGERY     cataracts  . KNEE ARTHROSCOPY Bilateral   . SHOULDER SURGERY Right    benign mass removed  . TONSILLECTOMY      No Known Allergies  Allergies as of 10/07/2016   No Known Allergies     Medication List       Accurate as of 10/07/16  3:04 PM. Always use your most recent med list.  acetaminophen 325 MG tablet Commonly known as:  TYLENOL Take 2 tablets (650 mg total) by mouth every 6 (six) hours as needed.   amantadine 50 MG/5ML solution Commonly known as:  SYMMETREL Take 100 mg by mouth 2 (two) times daily. 49m @@ 9am, 11pm   aspirin 81 MG tablet Take 81 mg by mouth daily.   buPROPion 75 MG tablet Commonly known as:  WELLBUTRIN Take 75 mg by mouth once. 9am   carbidopa-levodopa 25-100 MG tablet Commonly known as:  SINEMET IR Take 1-1.5 tablets by mouth 5 (five) times daily. 1 1/2@9am ; 1 1/2@11am , 2pm, 4pm; 1 @5 :30pm   Carbidopa-Levodopa ER 25-100 MG  tablet controlled release Commonly known as:  SINEMET CR Take 1 tablet by mouth at bedtime.   clopidogrel 75 MG tablet Commonly known as:  PLAVIX Take 75 mg by mouth daily. 12pm   dextromethorphan 30 MG/5ML liquid Commonly known as:  DELSYM Take 5 mLs (30 mg total) by mouth 2 (two) times daily.   donepezil 10 MG tablet Commonly known as:  ARICEPT Take 10 mg by mouth at bedtime.   guaiFENesin 600 MG 12 hr tablet Commonly known as:  MUCINEX Take 1 tablet (600 mg total) by mouth 2 (two) times daily.   ipratropium-albuterol 0.5-2.5 (3) MG/3ML Soln Commonly known as:  DUONEB Take 3 mLs by nebulization every 6 (six) hours as needed (for wheezing.).   levofloxacin 750 MG tablet Commonly known as:  LEVAQUIN Take 1 tablet (750 mg total) by mouth daily.   midodrine 2.5 MG tablet Commonly known as:  PROAMATINE Take 2.5 mg by mouth 3 (three) times daily as needed (blood pressure below 100/80).   naproxen 500 MG tablet Commonly known as:  NAPROSYN Take 1 tablet (500 mg total) by mouth 2 (two) times daily with [name] meal.   nitroGLYCERIN 0.4 MG SL tablet Commonly known as:  NITROSTAT Place 0.4 mg under the tongue every 5 (five) minutes as needed for chest pain.   omeprazole 40 MG capsule Commonly known as:  PRILOSEC Take 40 mg by mouth daily. 11pm   oxybutynin 5 MG tablet Commonly known as:  DITROPAN Take 5 mg by mouth 3 (three) times daily.   Pimavanserin Tartrate 17 MG Tabs Take 34 mg by mouth daily.   QUEtiapine 50 MG tablet Commonly known as:  SEROQUEL Take 100 mg by mouth at bedtime.   rOPINIRole 1 MG tablet Commonly known as:  REQUIP Take 1 mg by mouth 2 (two) times daily. 1 @9am , 2pm   rosuvastatin 5 MG tablet Commonly known as:  CRESTOR Take 5 mg by mouth at bedtime.   vitamin B-12 1000 MCG tablet Commonly known as:  CYANOCOBALAMIN Take 1,000 mcg by mouth daily.       Review of Systems  Constitutional: Positive for activity change, appetite change and  fatigue. Negative for chills, diaphoresis and fever.  HENT: Negative for congestion, sneezing, sore throat, trouble swallowing and voice change.   Respiratory: Positive for cough. Negative for apnea, choking, chest tightness, shortness of breath and wheezing.   Cardiovascular: Negative for chest pain, palpitations and leg swelling.  Gastrointestinal: Negative for abdominal distention, abdominal pain, constipation, diarrhea and nausea.  Genitourinary: Negative for difficulty urinating, dysuria, frequency and urgency.  Musculoskeletal: Negative for back pain, gait problem and myalgias. Arthralgias: typical arthritis.  Skin: Positive for pallor and wound. Negative for color change and rash.  Neurological: Positive for weakness. Negative for dizziness, tremors, syncope, speech difficulty, numbness and headaches.  Psychiatric/Behavioral: Positive for confusion  and hallucinations. Negative for agitation and behavioral problems.  All other systems reviewed and are negative.   Immunization History  Administered Date(s) Administered  . Tdap 10/02/2016   Pertinent  Health Maintenance Due  Topic Date Due  . COLONOSCOPY  07/11/1995  . PNA vac Low Risk Adult (1 of 2 - PCV13) 07/10/2010  . INFLUENZA VACCINE  04/27/2016   No flowsheet data found. Functional Status Survey:    Vitals:   10/07/16 0400  BP: (!) 127/94  Pulse: 85  Resp: 20  Temp: (!) 94 F (34.4 C)  SpO2: 94%   There is no height or weight on file to calculate BMI. Physical Exam  Constitutional: Vital signs are normal. He appears well-developed and well-nourished. He appears lethargic. He is active and cooperative. He does not appear ill. No distress.  HENT:  Head: Normocephalic and atraumatic.  Mouth/Throat: Uvula is midline, oropharynx is clear and moist and mucous membranes are normal. Mucous membranes are not pale, not dry and not cyanotic.  Eyes: Conjunctivae, EOM and lids are normal. Pupils are equal, round, and reactive  to light.  Neck: Trachea normal, normal range of motion and full passive range of motion without pain. Neck supple. No JVD present. No tracheal deviation, no edema and no erythema present. No thyromegaly present.  Cardiovascular: Normal rate, normal heart sounds and intact distal pulses.  An irregular rhythm present. Exam reveals no gallop, no distant heart sounds and no friction rub.   No murmur heard. Pulses:      Dorsalis pedis pulses are 1+ on the right side, and 1+ on the left side.  Pulmonary/Chest: Effort normal. No accessory muscle usage. No respiratory distress. He has no decreased breath sounds. He has no wheezes. He has no rhonchi. He has rales in the right lower field and the left lower field. He exhibits no tenderness.  Abdominal: Soft. Normal appearance and bowel sounds are normal. He exhibits no distension and no ascites. There is no tenderness.  Musculoskeletal: Normal range of motion. He exhibits no edema or tenderness.  Expected osteoarthritis, stiffness, generalized weakness. B-calves soft, supple. Negative Homan's sign  Neurological: He has normal strength. He appears lethargic. He is disoriented (intermittently).  Skin: Skin is warm and dry. He is not diaphoretic. No cyanosis. There is pallor (mildly). Nails show no clubbing.     Psychiatric: His speech is normal. Thought content normal. His affect is blunt. He is actively hallucinating (at times). Cognition and memory are impaired. He expresses impulsivity and inappropriate judgment. He exhibits abnormal recent memory.  Nursing note and vitals reviewed.   Labs reviewed:  Recent Labs  02/13/16 0645 09/29/16 1841 09/30/16 0415  NA 139 137 138  K 4.0 4.3 3.5  CL 108 102 104  CO2 25 28 25   GLUCOSE 109* 128* 137*  BUN 21* 14 11  CREATININE 0.87 0.89 0.92  CALCIUM 9.4 9.4 9.1    Recent Labs  02/13/16 0645 09/29/16 1841  AST 14* 22  ALT <5* <5*  ALKPHOS 73 64  BILITOT 0.7 0.7  PROT 6.7 7.4  ALBUMIN 3.8 4.0      Recent Labs  02/13/16 0645 09/29/16 1841 09/30/16 0415 10/01/16 0620  WBC 7.1 15.9* 13.1* 11.2*  NEUTROABS 4.8  --   --   --   HGB 12.7* 12.6* 12.2* 11.7*  HCT 37.9* 38.0* 35.6* 34.6*  MCV 90.3 94.8 93.7 94.1  PLT 197 219 187 190   No results found for: TSH No results found for: HGBA1C  Lab Results  Component Value Date   CHOL 96 03/12/2013   HDL 45 03/12/2013   LDLCALC 39 03/12/2013   TRIG 60 03/12/2013    Significant Diagnostic Results in last 30 days:  Dg Chest 2 View  Result Date: 09/29/2016 CLINICAL DATA:  Pain following fall EXAM: CHEST  2 VIEW COMPARISON:  Chest radiograph Feb 13, 2016; chest CT Feb 13, 2016 FINDINGS: There is patchy opacity in the right base region concerning for early pneumonia. Lungs elsewhere are clear. Heart size and pulmonary vascularity are within normal limits. There is atherosclerotic calcification in the aorta. Patient is status post coronary artery bypass grafting. No bone lesions. IMPRESSION: Patchy opacity in right base concerning for early pneumonia. Lungs elsewhere clear. [name] cardiac silhouette. There is aortic atherosclerosis. Followup PA and lateral chest radiographs recommended in 3-4 weeks following trial of antibiotic therapy to ensure resolution and exclude underlying malignancy. Electronically Signed   By: Lowella Grip III M.[name].   On: 09/29/2016 21:40   Dg Elbow Complete Left  Result Date: 10/02/2016 CLINICAL DATA:  Fall.  Elbow pain EXAM: LEFT ELBOW - COMPLETE 3+ VIEW COMPARISON:  None. FINDINGS: There is no evidence of fracture, dislocation, or joint effusion. There is no evidence of arthropathy or other focal bone abnormality. Soft tissues are unremarkable. IMPRESSION: Negative. Electronically Signed   By: Franchot Gallo M.[name].   On: 10/02/2016 18:27   Ct Head Wo Contrast  Result Date: 10/02/2016 CLINICAL DATA:  71 year old who fell today [name] without loss of consciousness. Generalized neck pain. Current  history Parkinson's disease. EXAM: CT HEAD WITHOUT CONTRAST CT CERVICAL SPINE WITHOUT CONTRAST TECHNIQUE: Multidetector CT imaging of the head and cervical spine was performed following the standard protocol without intravenous contrast. Multiplanar CT image reconstructions of the cervical spine were also generated. COMPARISON:  CT head 07/26/2016, 11/05/2013. MRI brain 04/18/2013, 04/24/2009. No prior cervical spine imaging. FINDINGS: CT HEAD FINDINGS Brain: Head tilt in the gantry accounts for apparent asymmetry in the cerebral hemispheres. Moderate cortical atrophy, unchanged since 2015. No significant deep or cerebellar atrophy. Mild changes of small vessel disease of the white matter diffusely, unchanged since 2015. No mass lesion. No midline shift. No acute hemorrhage or hematoma. No extra-axial fluid collections. No evidence of acute infarction. Vascular: Mild bilateral carotid siphon and left vertebral artery atherosclerosis. Skull: No skull fracture or other focal osseous abnormality involving the skull. Sinuses/Orbits: Complete opacification of the visualized left maxillary sinus and near complete opacification of the ethmoid air cells bilaterally. Air-fluid level in the right maxillary sinus, the anterior right ethmoid air cells, and both sphenoid sinuses. Mucosal thickening involving both sphenoid sinuses and the left frontal sinus. Apparent prior partial right mastoidectomy. Bilateral mastoid air cells and bilateral middle ear cavities well-aerated. Other: None. CT CERVICAL SPINE FINDINGS Alignment: Anatomic. Skull base and vertebrae: No acute fractures. Lateral masses intact. Dens intact. C1-C2 articulation intact. Soft tissues and spinal canal: Borderline spinal stenosis at the C5-6 level. Disc levels: Moderate to severe degenerative disc disease and spondylosis at C5-6. Upper chest: Visualized lung apices well-aerated. Other: Bilateral carotid artery atherosclerosis. IMPRESSION: 1. No acute  intracranial abnormalities. 2. No cervical spine fractures identified. 3. 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 4. Degenerative disc disease and spondylosis at C5-6 with borderline spinal stenosis at this level. Electronically Signed   By: Evangeline Dakin M.[name].   On: 10/02/2016 17:53   Ct Cervical Spine Wo Contrast  Result Date: 10/02/2016 CLINICAL DATA:  71 year old who fell [name] without loss of consciousness. Generalized neck pain. Current history Parkinson's disease. EXAM: CT HEAD WITHOUT CONTRAST CT CERVICAL SPINE WITHOUT CONTRAST TECHNIQUE: Multidetector CT imaging of the head and cervical spine was performed following the standard protocol without intravenous contrast. Multiplanar CT image reconstructions of the cervical spine were also generated. COMPARISON:  CT head 07/26/2016, 11/05/2013. MRI brain 04/18/2013, 04/24/2009. No prior cervical spine imaging. FINDINGS: CT HEAD FINDINGS Brain: Head tilt in the gantry accounts for apparent asymmetry in the cerebral hemispheres. Moderate cortical atrophy, unchanged since 2015. No significant deep or cerebellar atrophy. Mild changes of small vessel disease of the white matter diffusely, unchanged since 2015. No mass lesion. No midline shift. No acute hemorrhage or hematoma. No extra-axial fluid collections. No evidence of acute infarction. Vascular: Mild bilateral carotid siphon and left vertebral artery atherosclerosis. Skull: No skull fracture or other focal osseous abnormality involving the skull. Sinuses/Orbits: Complete opacification of the visualized left maxillary sinus and near complete opacification of the ethmoid air cells bilaterally. Air-fluid level in the right maxillary sinus, the anterior right ethmoid air cells, and both sphenoid sinuses. Mucosal thickening involving both sphenoid sinuses and the left frontal sinus.  Apparent prior partial right mastoidectomy. Bilateral mastoid air cells and bilateral middle ear cavities well-aerated. Other: None. CT CERVICAL SPINE FINDINGS Alignment: Anatomic. Skull base and vertebrae: No acute fractures. Lateral masses intact. Dens intact. C1-C2 articulation intact. Soft tissues and spinal canal: Borderline spinal stenosis at the C5-6 level. Disc levels: Moderate to severe degenerative disc disease and spondylosis at C5-6. Upper chest: Visualized lung apices well-aerated. Other: Bilateral carotid artery atherosclerosis. IMPRESSION: 1. No acute intracranial abnormalities. 2. No cervical spine fractures identified. 3. 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 4. Degenerative disc disease and spondylosis at C5-6 with borderline spinal stenosis at this level. Electronically Signed   By: Evangeline Dakin M.[name].   On: 10/02/2016 17:53   Dg Hip Unilat W Or Wo Pelvis 2-3 Views Left  Result Date: 10/02/2016 CLINICAL DATA:  Pt was discharged from the hospital this morning for PNA. Pt fell at senior care facility hit his head, left hip and left elbow. Pt poor historian. EXAM: DG HIP (WITH OR WITHOUT PELVIS) 2-3V LEFT COMPARISON:  None. FINDINGS: Hips are located. No evidence of pelvic fracture or sacral fracture. Dedicated view of the LEFT hip demonstrates no femoral neck fracture. IMPRESSION: No pelvic fracture or LEFT hip fracture. Electronically Signed   By: Suzy Bouchard M.[name].   On: 10/02/2016 18:10    Assessment/Plan 1. Community acquired pneumonia of left lower lobe of lung ([place])  2- view CXR to evaluate PNA  Levaquin completed yesterday for PNA  Continue prn duonebs  2. Parkinson's disease ([place])  Change Nuplazid to HS per home regimen  3. Malnutrition of moderate degree  Add Ensure clear BID between meals for nutritional support  Assess Albumin and protein on labs   Assess labs for  hydration status  IVF if necessary  If pt continues to have poor appetite through out the weekend, will add remeron on monday  4. Acute delirium  Add seroquel 25 mg po Q am to regimen  5. Skin tear of hand without complication, right, sequela  Allevyn dressing to right posterior hand.   Allevyn dressing to left elbow  Change Q 5 days  Family/ staff Communication:   Total Time:  Documentation:  Face to Face:  Family/Phone:   Labs/tests ordered:  Cbc, met c, tsh, mag+ vit B12, vit [name], 2-view cxr  Medication list reviewed and assessed for continued appropriateness.  Vikki Ports, NP-C Geriatrics [facility] [phone] [place], [place] [address] Cell Phone (Mon-Fri 8am-5pm):  [phone] On Call:  [phone] & follow prompts after 5pm & weekends Office Phone:  [phone] Office Fax:  [phone]

## 2016-10-08 ENCOUNTER — Non-Acute Institutional Stay (SKILLED_NURSING_FACILITY): Payer: PPO | Admitting: Gerontology

## 2016-10-08 DIAGNOSIS — R410 Disorientation, unspecified: Secondary | ICD-10-CM | POA: Diagnosis not present

## 2016-10-08 DIAGNOSIS — R05 Cough: Secondary | ICD-10-CM | POA: Diagnosis not present

## 2016-10-08 LAB — CBC WITH DIFFERENTIAL/PLATELET
BASOS ABS: 0 10*3/uL (ref 0–0.1)
Basophils Relative: 0 %
Eosinophils Absolute: 0.2 10*3/uL (ref 0–0.7)
Eosinophils Relative: 2 %
HCT: 35 % — ABNORMAL LOW (ref 40.0–52.0)
HEMOGLOBIN: 12 g/dL — AB (ref 13.0–18.0)
LYMPHS ABS: 1.8 10*3/uL (ref 1.0–3.6)
Lymphocytes Relative: 15 %
MCH: 31.6 pg (ref 26.0–34.0)
MCHC: 34.3 g/dL (ref 32.0–36.0)
MCV: 92.4 fL (ref 80.0–100.0)
MONOS PCT: 9 %
Monocytes Absolute: 1.1 10*3/uL — ABNORMAL HIGH (ref 0.2–1.0)
NEUTROS ABS: 8.9 10*3/uL — AB (ref 1.4–6.5)
Neutrophils Relative %: 74 %
PLATELETS: 284 10*3/uL (ref 150–440)
RBC: 3.79 MIL/uL — ABNORMAL LOW (ref 4.40–5.90)
RDW: 13.6 % (ref 11.5–14.5)
WBC: 12 10*3/uL — ABNORMAL HIGH (ref 3.8–10.6)

## 2016-10-08 LAB — VITAMIN D 25 HYDROXY (VIT D DEFICIENCY, FRACTURES): VIT D 25 HYDROXY: 22.9 ng/mL — AB (ref 30.0–100.0)

## 2016-10-11 ENCOUNTER — Non-Acute Institutional Stay (SKILLED_NURSING_FACILITY): Payer: PPO | Admitting: Gerontology

## 2016-10-11 DIAGNOSIS — R410 Disorientation, unspecified: Secondary | ICD-10-CM

## 2016-10-11 DIAGNOSIS — E440 Moderate protein-calorie malnutrition: Secondary | ICD-10-CM | POA: Diagnosis not present

## 2016-10-17 NOTE — Progress Notes (Signed)
Location:      Place of Service:  SNF (31) Provider:  Lorenso Quarry, NP-C  SPARKS,JEFFREY D, MD  Patient Care Team: Marguarite Arbour, MD as PCP - General (Internal Medicine)  Extended Emergency Contact Information Primary Emergency Contact: Domzalski,Catherine A Address: [address]          [place], [place] [address] D[place] of [place] Home Phone: [phone] Relation: Spouse Secondary Emergency Contact: Revonda Standard, [place] [address] [place] of [place] Mobile Phone: [phone] Relation: Daughter  Code Status:  full Goals of care: Advanced Directive information Advanced Directives 10/02/2016  Does Patient Have a Medical Advance Directive? Yes  Type of Advance Directive -  Does patient want to make changes to medical advance directive? -  Copy of Healthcare Power of Attorney in Chart? Yes     Chief Complaint  Patient presents with  . Follow-up    HPI:  Pt is a 71 y.o. male seen today for a f/u visit. Seroquel was increased yesterday for acute delirium. Family concerned with level of sedation today. I explained to family it will take several days for his body to adjust to increased dose. As long as pt was waking up to eat, interact with visitors, I was not too concerned and was comfortable monitoring over the weekend. Wife verbalized understanding and was agreeable to plan. At the time of the conversation with the wife, pt was awake, alert and conversive with visitors. Pt did not verbalize any concerns. VSS. No other complaints.   Past Medical History:  Diagnosis Date  . Angina pectoris (HCC)   . Arthritis    back  . Depression   . Heart disease   . Parkinson disease [facility])    Past Surgical History:  Procedure Laterality Date  . APPENDECTOMY    . CARDIAC SURGERY     stents on 12/13/02; 09/09/08; 12/16/10  . EYE SURGERY     cataracts  . KNEE ARTHROSCOPY Bilateral   . SHOULDER SURGERY Right    benign mass removed  . TONSILLECTOMY      No Known  Allergies  Allergies as of 10/08/2016   No Known Allergies     Medication List       Accurate as of 10/08/16 11:59 PM. Always use your most recent med list.          acetaminophen 325 MG tablet Commonly known as:  TYLENOL Take 2 tablets (650 mg total) by mouth every 6 (six) hours as needed.   amantadine 50 MG/5ML solution Commonly known as:  SYMMETREL Take 100 mg by mouth 2 (two) times daily. 10ml @@ 9am, 11pm   aspirin 81 MG tablet Take 81 mg by mouth daily.   buPROPion 75 MG tablet Commonly known as:  WELLBUTRIN Take 75 mg by mouth once. 9am   carbidopa-levodopa 25-100 MG tablet Commonly known as:  SINEMET IR Take 1-1.5 tablets by mouth 5 (five) times daily. 1 1/2@9am ; 1 1/2@11am , 2pm, 4pm; 1 @5 :30pm   Carbidopa-Levodopa ER 25-100 MG tablet controlled release Commonly known as:  SINEMET CR Take 1 tablet by mouth at bedtime.   clopidogrel 75 MG tablet Commonly known as:  PLAVIX Take 75 mg by mouth daily. 12pm   dextromethorphan 30 MG/5ML liquid Commonly known as:  DELSYM Take 5 mLs (30 mg total) by mouth 2 (two) times daily.   donepezil 10 MG tablet Commonly known as:  ARICEPT Take 10 mg by mouth at bedtime.  guaiFENesin 600 MG 12 hr tablet Commonly known as:  MUCINEX Take 1 tablet (600 mg total) by mouth 2 (two) times daily.   ipratropium-albuterol 0.5-2.5 (3) MG/3ML Soln Commonly known as:  DUONEB Take 3 mLs by nebulization every 6 (six) hours as needed (for wheezing.).   levofloxacin 750 MG tablet Commonly known as:  LEVAQUIN Take 1 tablet (750 mg total) by mouth daily.   midodrine 2.5 MG tablet Commonly known as:  PROAMATINE Take 2.5 mg by mouth 3 (three) times daily as needed (blood pressure below 100/80).   naproxen 500 MG tablet Commonly known as:  NAPROSYN Take 1 tablet (500 mg total) by mouth 2 (two) times daily with a meal.   nitroGLYCERIN 0.4 MG SL tablet Commonly known as:  NITROSTAT Place 0.4 mg under the tongue every 5 (five)  minutes as needed for chest pain.   omeprazole 40 MG capsule Commonly known as:  PRILOSEC Take 40 mg by mouth daily. 11pm   oxybutynin 5 MG tablet Commonly known as:  DITROPAN Take 5 mg by mouth 3 (three) times daily.   Pimavanserin Tartrate 17 MG Tabs Take 34 mg by mouth daily.   QUEtiapine 50 MG tablet Commonly known as:  SEROQUEL Take 100 mg by mouth at bedtime.   rOPINIRole 1 MG tablet Commonly known as:  REQUIP Take 1 mg by mouth 2 (two) times daily. 1 @9am , 2pm   rosuvastatin 5 MG tablet Commonly known as:  CRESTOR Take 5 mg by mouth at bedtime.   vitamin B-12 1000 MCG tablet Commonly known as:  CYANOCOBALAMIN Take 1,000 mcg by mouth daily.       Review of Systems  Constitutional: Positive for activity change, appetite change and fatigue. Negative for chills, diaphoresis and fever.  HENT: Negative for congestion, sneezing, sore throat, trouble swallowing and voice change.   Respiratory: Positive for cough. Negative for apnea, choking, chest tightness, shortness of breath and wheezing.   Cardiovascular: Negative for chest pain, palpitations and leg swelling.  Gastrointestinal: Negative for abdominal distention, abdominal pain, constipation, diarrhea and nausea.  Genitourinary: Negative for difficulty urinating, dysuria, frequency and urgency.  Musculoskeletal: Negative for back pain, gait problem and myalgias. Arthralgias: typical arthritis.  Skin: Positive for pallor and wound. Negative for color change and rash.  Neurological: Positive for weakness. Negative for dizziness, tremors, syncope, speech difficulty, numbness and headaches.  Psychiatric/Behavioral: Positive for confusion and hallucinations. Negative for agitation and behavioral problems.  All other systems reviewed and are negative.   Immunization History  Administered Date(s) Administered  . Tdap 10/02/2016   Pertinent  Health Maintenance Due  Topic Date Due  . COLONOSCOPY  07/11/1995  . PNA vac  Low Risk Adult (1 of 2 - PCV13) 07/10/2010  . INFLUENZA VACCINE  04/27/2016   No flowsheet data found. Functional Status Survey:    Vitals:   10/08/16 0600  BP: (!) 156/94  Pulse: 76  Resp: 18  Temp: 98 F (36.7 C)  SpO2: 94%   There is no height or weight on file to calculate BMI. Physical Exam  Constitutional: Vital signs are normal. He appears well-developed and well-nourished. He appears lethargic. He is active and cooperative. He does not appear ill. No distress.  HENT:  Head: Normocephalic and atraumatic.  Mouth/Throat: Uvula is midline, oropharynx is clear and moist and mucous membranes are normal. Mucous membranes are not pale, not dry and not cyanotic.  Eyes: Conjunctivae, EOM and lids are normal. Pupils are equal, round, and reactive to light.  Neck: Trachea normal, normal range of motion and full passive range of motion without pain. Neck supple. No JVD present. No tracheal deviation, no edema and no erythema present. No thyromegaly present.  Cardiovascular: Normal rate, normal heart sounds and intact distal pulses.  An irregular rhythm present. Exam reveals no gallop, no distant heart sounds and no friction rub.   No murmur heard. Pulses:      Dorsalis pedis pulses are 1+ on the right side, and 1+ on the left side.  Pulmonary/Chest: Effort normal. No accessory muscle usage. No respiratory distress. He has no decreased breath sounds. He has no wheezes. He has no rhonchi. He has rales (improved) in the right lower field and the left lower field. He exhibits no tenderness.  Abdominal: Soft. Normal appearance and bowel sounds are normal. He exhibits no distension and no ascites. There is no tenderness.  Musculoskeletal: Normal range of motion. He exhibits no edema or tenderness.  Expected osteoarthritis, stiffness, generalized weakness. B-calves soft, supple. Negative Homan's sign  Neurological: He has normal strength. He appears lethargic. He is disoriented (intermittently).    Skin: Skin is warm and dry. He is not diaphoretic. No cyanosis. There is pallor (mildly). Nails show no clubbing.     Psychiatric: His speech is normal. Thought content normal. His affect is blunt. He is actively hallucinating (at times). Cognition and memory are impaired. He expresses impulsivity and inappropriate judgment. He exhibits abnormal recent memory.  Nursing note and vitals reviewed.   Labs reviewed:  Recent Labs  09/29/16 1841 09/30/16 0415 10/07/16 1504  NA 137 138 141  K 4.3 3.5 3.3*  CL 102 104 105  CO2 28 25 27   GLUCOSE 128* 137* 117*  BUN 14 11 21*  CREATININE 0.89 0.92 0.83  CALCIUM 9.4 9.1 9.0  MG  --   --  2.4    Recent Labs  02/13/16 0645 09/29/16 1841 10/07/16 1504  AST 14* 22 26  ALT <5* <5* <5*  ALKPHOS 73 64 102  BILITOT 0.7 0.7 0.7  PROT 6.7 7.4 7.2  ALBUMIN 3.8 4.0 3.3*    Recent Labs  02/13/16 0645  09/30/16 0415 10/01/16 0620 10/08/16 0800  WBC 7.1  < > 13.1* 11.2* 12.0*  NEUTROABS 4.8  --   --   --  8.9*  HGB 12.7*  < > 12.2* 11.7* 12.0*  HCT 37.9*  < > 35.6* 34.6* 35.0*  MCV 90.3  < > 93.7 94.1 92.4  PLT 197  < > 187 190 284  < > = values in this interval not displayed. Lab Results  Component Value Date   TSH 1.436 10/07/2016   No results found for: HGBA1C Lab Results  Component Value Date   CHOL 96 03/12/2013   HDL 45 03/12/2013   LDLCALC 39 03/12/2013   TRIG 60 03/12/2013    Significant Diagnostic Results in last 30 days:  Dg Elbow Complete Left  Result Date: 10/02/2016 CLINICAL DATA:  Fall.  Elbow pain EXAM: LEFT ELBOW - COMPLETE 3+ VIEW COMPARISON:  None. FINDINGS: There is no evidence of fracture, dislocation, or joint effusion. There is no evidence of arthropathy or other focal bone abnormality. Soft tissues are unremarkable. IMPRESSION: Negative. Electronically Signed   By: Marlan Palau M.D.   On: 10/02/2016 18:27   Ct Head Wo Contrast  Result Date: 10/02/2016 CLINICAL DATA:  71 year old who fell today at a  rehabilitation facility without loss of consciousness. Generalized neck pain. Current history Parkinson's disease. EXAM: CT HEAD  WITHOUT CONTRAST CT CERVICAL SPINE WITHOUT CONTRAST TECHNIQUE: Multidetector CT imaging of the head and cervical spine was performed following the standard protocol without intravenous contrast. Multiplanar CT image reconstructions of the cervical spine were also generated. COMPARISON:  CT head 07/26/2016, 11/05/2013. MRI brain 04/18/2013, 04/24/2009. No prior cervical spine imaging. FINDINGS: CT HEAD FINDINGS Brain: Head tilt in the gantry accounts for apparent asymmetry in the cerebral hemispheres. Moderate cortical atrophy, unchanged since 2015. No significant deep or cerebellar atrophy. Mild changes of small vessel disease of the white matter diffusely, unchanged since 2015. No mass lesion. No midline shift. No acute hemorrhage or hematoma. No extra-axial fluid collections. No evidence of acute infarction. Vascular: Mild bilateral carotid siphon and left vertebral artery atherosclerosis. Skull: No skull fracture or other focal osseous abnormality involving the skull. Sinuses/Orbits: Complete opacification of the visualized left maxillary sinus and near complete opacification of the ethmoid air cells bilaterally. Air-fluid level in the right maxillary sinus, the anterior right ethmoid air cells, and both sphenoid sinuses. Mucosal thickening involving both sphenoid sinuses and the left frontal sinus. Apparent prior partial right mastoidectomy. Bilateral mastoid air cells and bilateral middle ear cavities well-aerated. Other: None. CT CERVICAL SPINE FINDINGS Alignment: Anatomic. Skull base and vertebrae: No acute fractures. Lateral masses intact. Dens intact. C1-C2 articulation intact. Soft tissues and spinal canal: Borderline spinal stenosis at the C5-6 level. Disc levels: Moderate to severe degenerative disc disease and spondylosis at C5-6. Upper chest: Visualized lung apices  well-aerated. Other: Bilateral carotid artery atherosclerosis. IMPRESSION: 1. No acute intracranial abnormalities. 2. No cervical spine fractures identified. 3. 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 4. Degenerative disc disease and spondylosis at C5-6 with borderline spinal stenosis at this level. Electronically Signed   By: Hulan Saashomas  Lawrence M.D.   On: 10/02/2016 17:53   Ct Cervical Spine Wo Contrast  Result Date: 10/02/2016 CLINICAL DATA:  [age] year old who fell today at a rehabilitation facility without loss of consciousness. Generalized neck pain. Current history Parkinson's disease. EXAM: CT HEAD WITHOUT CONTRAST CT CERVICAL SPINE WITHOUT CONTRAST TECHNIQUE: Multidetector CT imaging of the head and cervical spine was performed following the standard protocol without intravenous contrast. Multiplanar CT image reconstructions of the cervical spine were also generated. COMPARISON:  CT head 07/26/2016, 11/05/2013. MRI brain 04/18/2013, 04/24/2009. No prior cervical spine imaging. FINDINGS: CT HEAD FINDINGS Brain: Head tilt in the gantry accounts for apparent asymmetry in the cerebral hemispheres. Moderate cortical atrophy, unchanged since 2015. No significant deep or cerebellar atrophy. Mild changes of small vessel disease of the white matter diffusely, unchanged since 2015. No mass lesion. No midline shift. No acute hemorrhage or hematoma. No extra-axial fluid collections. No evidence of acute infarction. Vascular: Mild bilateral carotid siphon and left vertebral artery atherosclerosis. Skull: No skull fracture or other focal osseous abnormality involving the skull. Sinuses/Orbits: Complete opacification of the visualized left maxillary sinus and near complete opacification of the ethmoid air cells bilaterally. Air-fluid level in the right maxillary sinus, the anterior right ethmoid air cells, and both sphenoid sinuses.  Mucosal thickening involving both sphenoid sinuses and the left frontal sinus. Apparent prior partial right mastoidectomy. Bilateral mastoid air cells and bilateral middle ear cavities well-aerated. Other: None. CT CERVICAL SPINE FINDINGS Alignment: Anatomic. Skull base and vertebrae: No acute fractures. Lateral masses intact. Dens intact. C1-C2 articulation intact. Soft tissues and spinal canal: Borderline spinal stenosis at the C5-6 level. Disc levels: Moderate to severe degenerative disc disease and spondylosis  at C5-6. Upper chest: Visualized lung apices well-aerated. Other: Bilateral carotid artery atherosclerosis. IMPRESSION: 1. No acute intracranial abnormalities. 2. No cervical spine fractures identified. 3. 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 4. Degenerative disc disease and spondylosis at C5-6 with borderline spinal stenosis at this level. Electronically Signed   By: Hulan Saas M.D.   On: 10/02/2016 17:53   Dg Hip Unilat W Or Wo Pelvis 2-3 Views Left  Result Date: 10/02/2016 CLINICAL DATA:  Pt was discharged from the hospital this morning for PNA. Pt fell at senior care facility hit his head, left hip and left elbow. Pt poor historian. EXAM: DG HIP (WITH OR WITHOUT PELVIS) 2-3V LEFT COMPARISON:  None. FINDINGS: Hips are located. No evidence of pelvic fracture or sacral fracture. Dedicated view of the LEFT hip demonstrates no femoral neck fracture. IMPRESSION: No pelvic fracture or LEFT hip fracture. Electronically Signed   By: Genevive Bi M.D.   On: 10/02/2016 18:10    Assessment/Plan 1. Acute delirium  Continue Seroquel 50 mg po Q HS  Continue Seroquel 25 mg po Q Day    Family/ staff Communication:   Total Time:  Documentation:  Face to Face:  Family/Phone:   Labs/tests ordered:    Brynda Rim, NP-C Geriatrics [facility] [address], [place] [address] Cell Phone (Mon-Fri 8am-5pm):  [phone] On Call:  [phone] & follow prompts after 5pm & weekends Office Phone:  [phone] Office Fax:  [phone]

## 2016-10-17 NOTE — Progress Notes (Signed)
This encounter was created in error - please disregard.

## 2016-10-19 ENCOUNTER — Other Ambulatory Visit: Payer: Self-pay | Admitting: Internal Medicine

## 2016-10-19 DIAGNOSIS — F39 Unspecified mood [affective] disorder: Secondary | ICD-10-CM | POA: Diagnosis not present

## 2016-10-19 DIAGNOSIS — G20 Parkinson's disease: Secondary | ICD-10-CM | POA: Diagnosis not present

## 2016-10-19 DIAGNOSIS — F333 Major depressive disorder, recurrent, severe with psychotic symptoms: Secondary | ICD-10-CM | POA: Diagnosis not present

## 2016-10-19 DIAGNOSIS — F0281 Dementia in other diseases classified elsewhere with behavioral disturbance: Secondary | ICD-10-CM | POA: Diagnosis not present

## 2016-10-19 DIAGNOSIS — R1312 Dysphagia, oropharyngeal phase: Secondary | ICD-10-CM

## 2016-10-22 ENCOUNTER — Non-Acute Institutional Stay (SKILLED_NURSING_FACILITY): Payer: PPO | Admitting: Gerontology

## 2016-10-22 DIAGNOSIS — E440 Moderate protein-calorie malnutrition: Secondary | ICD-10-CM

## 2016-10-22 DIAGNOSIS — R410 Disorientation, unspecified: Secondary | ICD-10-CM

## 2016-10-22 DIAGNOSIS — G20 Parkinson's disease: Secondary | ICD-10-CM

## 2016-10-22 DIAGNOSIS — S61411S Laceration without foreign body of right hand, sequela: Secondary | ICD-10-CM | POA: Diagnosis not present

## 2016-10-22 DIAGNOSIS — R05 Cough: Secondary | ICD-10-CM | POA: Diagnosis not present

## 2016-10-22 DIAGNOSIS — J181 Lobar pneumonia, unspecified organism: Secondary | ICD-10-CM | POA: Diagnosis not present

## 2016-10-22 DIAGNOSIS — J189 Pneumonia, unspecified organism: Secondary | ICD-10-CM

## 2016-10-22 LAB — INFLUENZA PANEL BY PCR (TYPE A & B)
Influenza A By PCR: NEGATIVE
Influenza B By PCR: NEGATIVE

## 2016-10-26 NOTE — Progress Notes (Signed)
Location:      Place of Service:  SNF (31) Provider:  Lorenso Quarry, NP-C  SPARKS,JEFFREY D, MD  Patient Care Team: Marguarite Arbour, MD as PCP - General (Internal Medicine)  Extended Emergency Contact Information Primary Emergency Contact: Landi,Catherine A Address: [address]          [place], [place] [address] [place] of [place] Home Phone: [phone] Relation: Spouse Secondary Emergency Contact: Revonda Standard, [place] [address] [place] of [place] Mobile Phone: [phone] Relation: Daughter  Code Status:  full Goals of care: Advanced Directive information Advanced Directives 10/02/2016  Does Patient Have a Medical Advance Directive? Yes  Type of Advance Directive -  Does patient want to make changes to medical advance directive? -  Copy of Healthcare Power of Attorney in Chart? Yes     Chief Complaint  Patient presents with  . Follow-up    HPI:  Pt is a 71 y.o. male seen today for a f/u visit. Seroquel was increased last week for acute delirium. Staff now concerned that he is refusing to eat and refusing medications. Staff also have noted increase in combativeness at night time. Family concerned with increased sleepiness during the day. I explained that it is possible that he is awake all night- confused and combative, feeling scared. Then when family arrives, he feels safe and is then able to go to sleep. At the time of the conversation with the wife, pt was awake, alert and conversive with visitors. Pt did not verbalize any concerns. VSS. No other complaints.   Past Medical History:  Diagnosis Date  . Angina pectoris (HCC)   . Arthritis    back  . Depression   . Heart disease   . Parkinson disease [facility])    Past Surgical History:  Procedure Laterality Date  . APPENDECTOMY    . CARDIAC SURGERY     stents on 12/13/02; 09/09/08; 12/16/10  . EYE SURGERY     cataracts  . KNEE ARTHROSCOPY Bilateral   . SHOULDER SURGERY Right    benign mass removed   . TONSILLECTOMY      No Known Allergies  Allergies as of 10/11/2016   No Known Allergies     Medication List       Accurate as of 10/11/16 11:59 PM. Always use your most recent med list.          acetaminophen 325 MG tablet Commonly known as:  TYLENOL Take 2 tablets (650 mg total) by mouth every 6 (six) hours as needed.   amantadine 50 MG/5ML solution Commonly known as:  SYMMETREL Take 100 mg by mouth 2 (two) times daily. 10ml @@ 9am, 11pm   aspirin 81 MG tablet Take 81 mg by mouth daily.   buPROPion 75 MG tablet Commonly known as:  WELLBUTRIN Take 75 mg by mouth once. 9am   carbidopa-levodopa 25-100 MG tablet Commonly known as:  SINEMET IR Take 1-1.5 tablets by mouth 5 (five) times daily. 1 1/2@9am ; 1 1/2@11am , 2pm, 4pm; 1 @5 :30pm   Carbidopa-Levodopa ER 25-100 MG tablet controlled release Commonly known as:  SINEMET CR Take 1 tablet by mouth at bedtime.   clopidogrel 75 MG tablet Commonly known as:  PLAVIX Take 75 mg by mouth daily. 12pm   dextromethorphan 30 MG/5ML liquid Commonly known as:  DELSYM Take 5 mLs (30 mg total) by mouth 2 (two) times daily.   donepezil 10 MG tablet Commonly known as:  ARICEPT Take  10 mg by mouth at bedtime.   guaiFENesin 600 MG 12 hr tablet Commonly known as:  MUCINEX Take 1 tablet (600 mg total) by mouth 2 (two) times daily.   ipratropium-albuterol 0.5-2.5 (3) MG/3ML Soln Commonly known as:  DUONEB Take 3 mLs by nebulization every 6 (six) hours as needed (for wheezing.).   levofloxacin 750 MG tablet Commonly known as:  LEVAQUIN Take 1 tablet (750 mg total) by mouth daily.   midodrine 2.5 MG tablet Commonly known as:  PROAMATINE Take 2.5 mg by mouth 3 (three) times daily as needed (blood pressure below 100/80).   naproxen 500 MG tablet Commonly known as:  NAPROSYN Take 1 tablet (500 mg total) by mouth 2 (two) times daily with a meal.   nitroGLYCERIN 0.4 MG SL tablet Commonly known as:  NITROSTAT Place 0.4 mg  under the tongue every 5 (five) minutes as needed for chest pain.   omeprazole 40 MG capsule Commonly known as:  PRILOSEC Take 40 mg by mouth daily. 11pm   oxybutynin 5 MG tablet Commonly known as:  DITROPAN Take 5 mg by mouth 3 (three) times daily.   Pimavanserin Tartrate 17 MG Tabs Take 34 mg by mouth daily.   QUEtiapine 50 MG tablet Commonly known as:  SEROQUEL Take 100 mg by mouth at bedtime.   rOPINIRole 1 MG tablet Commonly known as:  REQUIP Take 1 mg by mouth 2 (two) times daily. 1 @9am , 2pm   rosuvastatin 5 MG tablet Commonly known as:  CRESTOR Take 5 mg by mouth at bedtime.   vitamin B-12 1000 MCG tablet Commonly known as:  CYANOCOBALAMIN Take 1,000 mcg by mouth daily.       Review of Systems  Constitutional: Positive for activity change, appetite change and fatigue. Negative for chills, diaphoresis and fever.  HENT: Negative for congestion, sneezing, sore throat, trouble swallowing and voice change.   Respiratory: Positive for cough. Negative for apnea, choking, chest tightness, shortness of breath and wheezing.   Cardiovascular: Negative for chest pain, palpitations and leg swelling.  Gastrointestinal: Negative for abdominal distention, abdominal pain, constipation, diarrhea and nausea.  Genitourinary: Negative for difficulty urinating, dysuria, frequency and urgency.  Musculoskeletal: Negative for back pain, gait problem and myalgias. Arthralgias: typical arthritis.  Skin: Positive for pallor and wound. Negative for color change and rash.  Neurological: Positive for weakness. Negative for dizziness, tremors, syncope, speech difficulty, numbness and headaches.  Psychiatric/Behavioral: Positive for confusion and hallucinations. Negative for agitation and behavioral problems.  All other systems reviewed and are negative.   Immunization History  Administered Date(s) Administered  . Tdap 10/02/2016   Pertinent  Health Maintenance Due  Topic Date Due  .  COLONOSCOPY  07/11/1995  . PNA vac Low Risk Adult (1 of 2 - PCV13) 07/10/2010  . INFLUENZA VACCINE  04/27/2016   No flowsheet data found. Functional Status Survey:    Vitals:   10/11/16 0600  BP: (!) 166/85  Pulse: 66  Resp: 20  Temp: 97 F (36.1 C)  SpO2: 96%  Weight: 175 lb (79.4 kg)   Body mass index is 23.73 kg/m. Physical Exam  Constitutional: Vital signs are normal. He appears well-developed and well-nourished. He appears lethargic. He is active and cooperative. He does not appear ill. No distress.  HENT:  Head: Normocephalic and atraumatic.  Mouth/Throat: Uvula is midline, oropharynx is clear and moist and mucous membranes are normal. Mucous membranes are not pale, not dry and not cyanotic.  Eyes: Conjunctivae, EOM and lids are normal.  Pupils are equal, round, and reactive to light.  Neck: Trachea normal, normal range of motion and full passive range of motion without pain. Neck supple. No JVD present. No tracheal deviation, no edema and no erythema present. No thyromegaly present.  Cardiovascular: Normal rate, normal heart sounds and intact distal pulses.  An irregular rhythm present. Exam reveals no gallop, no distant heart sounds and no friction rub.   No murmur heard. Pulses:      Dorsalis pedis pulses are 1+ on the right side, and 1+ on the left side.  Pulmonary/Chest: Effort normal. No accessory muscle usage. No respiratory distress. He has no decreased breath sounds. He has no wheezes. He has no rhonchi. He has rales (improved) in the right lower field and the left lower field. He exhibits no tenderness.  Abdominal: Soft. Normal appearance and bowel sounds are normal. He exhibits no distension and no ascites. There is no tenderness.  Musculoskeletal: Normal range of motion. He exhibits no edema or tenderness.  Expected osteoarthritis, stiffness, generalized weakness. B-calves soft, supple. Negative Homan's sign  Neurological: He has normal strength. He appears  lethargic. He is disoriented (intermittently).  Skin: Skin is warm and dry. He is not diaphoretic. No cyanosis. There is pallor (mildly). Nails show no clubbing.     Psychiatric: His speech is normal. Thought content normal. His affect is blunt. He is actively hallucinating (at times). Cognition and memory are impaired. He expresses impulsivity and inappropriate judgment. He exhibits abnormal recent memory.  Nursing note and vitals reviewed.   Labs reviewed:  Recent Labs  09/29/16 1841 09/30/16 0415 10/07/16 1504  NA 137 138 141  K 4.3 3.5 3.3*  CL 102 104 105  CO2 28 25 27   GLUCOSE 128* 137* 117*  BUN 14 11 21*  CREATININE 0.89 0.92 0.83  CALCIUM 9.4 9.1 9.0  MG  --   --  2.4    Recent Labs  02/13/16 0645 09/29/16 1841 10/07/16 1504  AST 14* 22 26  ALT <5* <5* <5*  ALKPHOS 73 64 102  BILITOT 0.7 0.7 0.7  PROT 6.7 7.4 7.2  ALBUMIN 3.8 4.0 3.3*    Recent Labs  02/13/16 0645  09/30/16 0415 10/01/16 0620 10/08/16 0800  WBC 7.1  < > 13.1* 11.2* 12.0*  NEUTROABS 4.8  --   --   --  8.9*  HGB 12.7*  < > 12.2* 11.7* 12.0*  HCT 37.9*  < > 35.6* 34.6* 35.0*  MCV 90.3  < > 93.7 94.1 92.4  PLT 197  < > 187 190 284  < > = values in this interval not displayed. Lab Results  Component Value Date   TSH 1.436 10/07/2016   No results found for: HGBA1C Lab Results  Component Value Date   CHOL 96 03/12/2013   HDL 45 03/12/2013   LDLCALC 39 03/12/2013   TRIG 60 03/12/2013    Significant Diagnostic Results in last 30 days:  Dg Elbow Complete Left  Result Date: 10/02/2016 CLINICAL DATA:  Fall.  Elbow pain EXAM: LEFT ELBOW - COMPLETE 3+ VIEW COMPARISON:  None. FINDINGS: There is no evidence of fracture, dislocation, or joint effusion. There is no evidence of arthropathy or other focal bone abnormality. Soft tissues are unremarkable. IMPRESSION: Negative. Electronically Signed   By: Marlan Palau M.D.   On: 10/02/2016 18:27   Ct Head Wo Contrast  Result Date:  10/02/2016 CLINICAL DATA:  71 year old who fell today at a rehabilitation facility without loss of consciousness. Generalized neck  pain. Current history Parkinson's disease. EXAM: CT HEAD WITHOUT CONTRAST CT CERVICAL SPINE WITHOUT CONTRAST TECHNIQUE: Multidetector CT imaging of the head and cervical spine was performed following the standard protocol without intravenous contrast. Multiplanar CT image reconstructions of the cervical spine were also generated. COMPARISON:  CT head 07/26/2016, 11/05/2013. MRI brain 04/18/2013, 04/24/2009. No prior cervical spine imaging. FINDINGS: CT HEAD FINDINGS Brain: Head tilt in the gantry accounts for apparent asymmetry in the cerebral hemispheres. Moderate cortical atrophy, unchanged since 2015. No significant deep or cerebellar atrophy. Mild changes of small vessel disease of the white matter diffusely, unchanged since 2015. No mass lesion. No midline shift. No acute hemorrhage or hematoma. No extra-axial fluid collections. No evidence of acute infarction. Vascular: Mild bilateral carotid siphon and left vertebral artery atherosclerosis. Skull: No skull fracture or other focal osseous abnormality involving the skull. Sinuses/Orbits: Complete opacification of the visualized left maxillary sinus and near complete opacification of the ethmoid air cells bilaterally. Air-fluid level in the right maxillary sinus, the anterior right ethmoid air cells, and both sphenoid sinuses. Mucosal thickening involving both sphenoid sinuses and the left frontal sinus. Apparent prior partial right mastoidectomy. Bilateral mastoid air cells and bilateral middle ear cavities well-aerated. Other: None. CT CERVICAL SPINE FINDINGS Alignment: Anatomic. Skull base and vertebrae: No acute fractures. Lateral masses intact. Dens intact. C1-C2 articulation intact. Soft tissues and spinal canal: Borderline spinal stenosis at the C5-6 level. Disc levels: Moderate to severe degenerative disc disease and  spondylosis at C5-6. Upper chest: Visualized lung apices well-aerated. Other: Bilateral carotid artery atherosclerosis. IMPRESSION: 1. No acute intracranial abnormalities. 2. No cervical spine fractures identified. 3. 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 4. Degenerative disc disease and spondylosis at C5-6 with borderline spinal stenosis at this level. Electronically Signed   By: Hulan Saas M.D.   On: 10/02/2016 17:53   Ct Cervical Spine Wo Contrast  Result Date: 10/02/2016 CLINICAL DATA:  71 year old who fell today at a rehabilitation facility without loss of consciousness. Generalized neck pain. Current history Parkinson's disease. EXAM: CT HEAD WITHOUT CONTRAST CT CERVICAL SPINE WITHOUT CONTRAST TECHNIQUE: Multidetector CT imaging of the head and cervical spine was performed following the standard protocol without intravenous contrast. Multiplanar CT image reconstructions of the cervical spine were also generated. COMPARISON:  CT head 07/26/2016, 11/05/2013. MRI brain 04/18/2013, 04/24/2009. No prior cervical spine imaging. FINDINGS: CT HEAD FINDINGS Brain: Head tilt in the gantry accounts for apparent asymmetry in the cerebral hemispheres. Moderate cortical atrophy, unchanged since 2015. No significant deep or cerebellar atrophy. Mild changes of small vessel disease of the white matter diffusely, unchanged since 2015. No mass lesion. No midline shift. No acute hemorrhage or hematoma. No extra-axial fluid collections. No evidence of acute infarction. Vascular: Mild bilateral carotid siphon and left vertebral artery atherosclerosis. Skull: No skull fracture or other focal osseous abnormality involving the skull. Sinuses/Orbits: Complete opacification of the visualized left maxillary sinus and near complete opacification of the ethmoid air cells bilaterally. Air-fluid level in the right maxillary sinus, the  anterior right ethmoid air cells, and both sphenoid sinuses. Mucosal thickening involving both sphenoid sinuses and the left frontal sinus. Apparent prior partial right mastoidectomy. Bilateral mastoid air cells and bilateral middle ear cavities well-aerated. Other: None. CT CERVICAL SPINE FINDINGS Alignment: Anatomic. Skull base and vertebrae: No acute fractures. Lateral masses intact. Dens intact. C1-C2 articulation intact. Soft tissues and spinal canal: Borderline spinal stenosis at the C5-6 level. Disc levels:  Moderate to severe degenerative disc disease and spondylosis at C5-6. Upper chest: Visualized lung apices well-aerated. Other: Bilateral carotid artery atherosclerosis. IMPRESSION: 1. No acute intracranial abnormalities. 2. No cervical spine fractures identified. 3. 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 4. Degenerative disc disease and spondylosis at C5-6 with borderline spinal stenosis at this level. Electronically Signed   By: Hulan Saas M.D.   On: 10/02/2016 17:53   Dg Hip Unilat W Or Wo Pelvis 2-3 Views Left  Result Date: 10/02/2016 CLINICAL DATA:  Pt was discharged from the hospital this morning for PNA. Pt fell at senior care facility hit his head, left hip and left elbow. Pt poor historian. EXAM: DG HIP (WITH OR WITHOUT PELVIS) 2-3V LEFT COMPARISON:  None. FINDINGS: Hips are located. No evidence of pelvic fracture or sacral fracture. Dedicated view of the LEFT hip demonstrates no femoral neck fracture. IMPRESSION: No pelvic fracture or LEFT hip fracture. Electronically Signed   By: Genevive Bi M.D.   On: 10/02/2016 18:10    Assessment/Plan 1. Acute delirium  Continue Seroquel 50 mg po Q HS  Continue Seroquel 25 mg po Q Day  Melatonin 3 mg po Q HS for sleep  [facility] referral   2. Malnutrition, of moderate degree  Mirtazipine 7.5 mg po Q HS  Family/ staff  Communication:   Total Time:  Documentation:  Face to Face:  Family/Phone:   Labs/tests ordered:    Brynda Rim, NP-C Geriatrics [facility] [address], [place] [address] Cell Phone (Mon-Fri 8am-5pm):  [phone] On Call:  [phone] & follow prompts after 5pm & weekends Office Phone:  [phone] Office Fax:  [phone]

## 2016-10-26 NOTE — Progress Notes (Signed)
Location:      Place of Service:  SNF (31) Provider:  Lorenso Quarry, NP-C  SPARKS,JEFFREY D, MD  Patient Care Team: Marguarite Arbour, MD as PCP - General (Internal Medicine)  Extended Emergency Contact Information Primary Emergency Contact: Charley,Catherine A Address: [address]          [place], [place] [address] D[place] of [place] Home Phone: [phone] Relation: Spouse Secondary Emergency Contact: Revonda Standard, [place] [address] [place] of [place] Mobile Phone: [phone] Relation: Daughter  Code Status:  full Goals of care: Advanced Directive information Advanced Directives 10/02/2016  Does Patient Have a Medical Advance Directive? Yes  Type of Advance Directive -  Does patient want to make changes to medical advance directive? -  Copy of Healthcare Power of Attorney in Chart? Yes     Chief Complaint  Patient presents with  . Discharge Note    HPI:  Pt is a 71 y.o. male seen today for an acute visit for multiple concerns. Wife wanted to have [place] assess the status of the PNA. Pt denies cough or fever. Wife denies choking with eating/drinking. Afebrile. Though pt does have crackles in BLL. Will obtain fu cxr. Wife also reports he is looking better today and is conversant when he was not so yesterday. Wife reports he has had increased confusion/ delirium since admission. He has had multiple falls at home and three falls since admission, with confusion. We discussed the trajectory of delirium in dementia pts. Wife verbalized she understood. We discussed adding low dose seroquel in the morning to his regimen. Wife was agreeable and will let me know if pt becomes too sedate, etc with increased dose. Wife reports he is not eating or drinking but that he does not like the Ensures. We discussed trying the Ensure clear and see if he likes those. We discussed hydration status and importance of protein and calories for healing. She understood. Wife also reports he "had  started getting thin at home." Wife agreeable to appetite stimulant after the weekend if appetite has not improved with increased seroquel. Pt has multiple skin tears. Left elbow happened at home. Right wrist tear happened last night. He fell in his room and crawled into the hallway. This is a large skin tear with a non-viable skin flap. Covered now with tegaderm, has ++ sangenous drainage. No redness or warmth. No signs of infection at this point. Will make medication adjustments and dietary adjustments, labs and xrays today. Will monitor over the weekend. If no improvement seen, will discuss with wife about a palliative care consult. Wife/pt denies n/v/d/c/cp/sob/ha/abd pain/dizziness/ pain. VSS. No other complaints   Past Medical History:  Diagnosis Date  . Angina pectoris (HCC)   . Arthritis    back  . Depression   . Heart disease   . Parkinson disease [facility])    Past Surgical History:  Procedure Laterality Date  . APPENDECTOMY    . CARDIAC SURGERY     stents on 12/13/02; 09/09/08; 12/16/10  . EYE SURGERY     cataracts  . KNEE ARTHROSCOPY Bilateral   . SHOULDER SURGERY Right    benign mass removed  . TONSILLECTOMY      No Known Allergies  Allergies as of 10/22/2016   No Known Allergies     Medication List       Accurate as of 10/22/16 11:59 PM. Always use your most recent med list.  acetaminophen 325 MG tablet Commonly known as:  TYLENOL Take 2 tablets (650 mg total) by mouth every 6 (six) hours as needed.   amantadine 50 MG/5ML solution Commonly known as:  SYMMETREL Take 100 mg by mouth 2 (two) times daily. 10ml @@ 9am, 11pm   aspirin 81 MG tablet Take 81 mg by mouth daily.   buPROPion 75 MG tablet Commonly known as:  WELLBUTRIN Take 75 mg by mouth once. 9am   carbidopa-levodopa 25-100 MG tablet Commonly known as:  SINEMET IR Take 1-1.5 tablets by mouth 5 (five) times daily. 1 1/2@9am ; 1 1/2@11am , 2pm, 4pm; 1 @5 :30pm   Carbidopa-Levodopa ER 25-100 MG  tablet controlled release Commonly known as:  SINEMET CR Take 1 tablet by mouth at bedtime.   clopidogrel 75 MG tablet Commonly known as:  PLAVIX Take 75 mg by mouth daily. 12pm   dextromethorphan 30 MG/5ML liquid Commonly known as:  DELSYM Take 5 mLs (30 mg total) by mouth 2 (two) times daily.   donepezil 10 MG tablet Commonly known as:  ARICEPT Take 10 mg by mouth at bedtime.   guaiFENesin 600 MG 12 hr tablet Commonly known as:  MUCINEX Take 1 tablet (600 mg total) by mouth 2 (two) times daily.   levofloxacin 750 MG tablet Commonly known as:  LEVAQUIN Take 1 tablet (750 mg total) by mouth daily.   midodrine 2.5 MG tablet Commonly known as:  PROAMATINE Take 2.5 mg by mouth 3 (three) times daily as needed (blood pressure below 100/80).   naproxen 500 MG tablet Commonly known as:  NAPROSYN Take 1 tablet (500 mg total) by mouth 2 (two) times daily with a meal.   nitroGLYCERIN 0.4 MG SL tablet Commonly known as:  NITROSTAT Place 0.4 mg under the tongue every 5 (five) minutes as needed for chest pain.   omeprazole 40 MG capsule Commonly known as:  PRILOSEC Take 40 mg by mouth daily. 11pm   oxybutynin 5 MG tablet Commonly known as:  DITROPAN Take 5 mg by mouth 3 (three) times daily.   Pimavanserin Tartrate 17 MG Tabs Take 34 mg by mouth daily.   QUEtiapine 50 MG tablet Commonly known as:  SEROQUEL Take 100 mg by mouth at bedtime.   rOPINIRole 1 MG tablet Commonly known as:  REQUIP Take 1 mg by mouth 2 (two) times daily. 1 @9am , 2pm   rosuvastatin 5 MG tablet Commonly known as:  CRESTOR Take 5 mg by mouth at bedtime.   vitamin B-12 1000 MCG tablet Commonly known as:  CYANOCOBALAMIN Take 1,000 mcg by mouth daily.       Review of Systems  Constitutional: Positive for activity change, appetite change and fatigue. Negative for chills, diaphoresis and fever.  HENT: Negative for congestion, sneezing, sore throat, trouble swallowing and voice change.     Respiratory: Negative for apnea, cough, choking, chest tightness, shortness of breath and wheezing.   Cardiovascular: Negative for chest pain, palpitations and leg swelling.  Gastrointestinal: Negative for abdominal distention, abdominal pain, constipation, diarrhea and nausea.  Genitourinary: Negative for difficulty urinating, dysuria, frequency and urgency.  Musculoskeletal: Negative for back pain, gait problem and myalgias. Arthralgias: typical arthritis.  Skin: Positive for pallor. Negative for color change, rash and wound.  Neurological: Positive for weakness. Negative for dizziness, tremors, syncope, speech difficulty, numbness and headaches.  Psychiatric/Behavioral: Positive for confusion and hallucinations. Negative for agitation (at times) and behavioral problems.  All other systems reviewed and are negative.   Immunization History  Administered Date(s) Administered  .  Tdap 10/02/2016   Pertinent  Health Maintenance Due  Topic Date Due  . COLONOSCOPY  07/11/1995  . PNA vac Low Risk Adult (1 of 2 - PCV13) 07/10/2010  . INFLUENZA VACCINE  04/27/2016   No flowsheet data found. Functional Status Survey:    Vitals:   10/22/16 0430  BP: 137/75  Pulse: 60  Resp: 20  Temp: 97.5 F (36.4 C)  SpO2: 96%   There is no height or weight on file to calculate BMI. Physical Exam  Constitutional: Vital signs are normal. He appears well-developed and well-nourished. He appears lethargic. He is active and cooperative. He does not appear ill. No distress.  HENT:  Head: Normocephalic and atraumatic.  Mouth/Throat: Uvula is midline, oropharynx is clear and moist and mucous membranes are normal. Mucous membranes are not pale, not dry and not cyanotic.  Eyes: Conjunctivae, EOM and lids are normal. Pupils are equal, round, and reactive to light.  Neck: Trachea normal, normal range of motion and full passive range of motion without pain. Neck supple. No JVD present. No tracheal deviation, no  edema and no erythema present. No thyromegaly present.  Cardiovascular: Normal rate, normal heart sounds and intact distal pulses.  An irregular rhythm present. Exam reveals no gallop, no distant heart sounds and no friction rub.   No murmur heard. Pulses:      Dorsalis pedis pulses are 1+ on the right side, and 1+ on the left side.  Pulmonary/Chest: Effort normal. No accessory muscle usage. No respiratory distress. He has no decreased breath sounds. He has no wheezes. He has no rhonchi. He has no rales. He exhibits no tenderness.  Abdominal: Soft. Normal appearance and bowel sounds are normal. He exhibits no distension and no ascites. There is no tenderness.  Musculoskeletal: Normal range of motion. He exhibits no edema or tenderness.  Expected osteoarthritis, stiffness, generalized weakness. B-calves soft, supple. Negative Homan's sign  Neurological: He has normal strength. He appears lethargic. He is disoriented (intermittently).  Skin: Skin is warm and dry. No rash noted. He is not diaphoretic. No cyanosis or erythema. There is pallor (mildly). Nails show no clubbing.     Psychiatric: His speech is normal. Thought content normal. His affect is blunt. He is actively hallucinating (at times). Cognition and memory are impaired. He expresses impulsivity and inappropriate judgment. He exhibits abnormal recent memory.  Nursing note and vitals reviewed.   Labs reviewed:  Recent Labs  09/29/16 1841 09/30/16 0415 10/07/16 1504  NA 137 138 141  K 4.3 3.5 3.3*  CL 102 104 105  CO2 28 25 27   GLUCOSE 128* 137* 117*  BUN 14 11 21*  CREATININE 0.89 0.92 0.83  CALCIUM 9.4 9.1 9.0  MG  --   --  2.4    Recent Labs  02/13/16 0645 09/29/16 1841 10/07/16 1504  AST 14* 22 26  ALT <5* <5* <5*  ALKPHOS 73 64 102  BILITOT 0.7 0.7 0.7  PROT 6.7 7.4 7.2  ALBUMIN 3.8 4.0 3.3*    Recent Labs  02/13/16 0645  09/30/16 0415 10/01/16 0620 10/08/16 0800  WBC 7.1  < > 13.1* 11.2* 12.0*    NEUTROABS 4.8  --   --   --  8.9*  HGB 12.7*  < > 12.2* 11.7* 12.0*  HCT 37.9*  < > 35.6* 34.6* 35.0*  MCV 90.3  < > 93.7 94.1 92.4  PLT 197  < > 187 190 284  < > = values in this interval not displayed.  Lab Results  Component Value Date   TSH 1.436 10/07/2016   No results found for: HGBA1C Lab Results  Component Value Date   CHOL 96 03/12/2013   HDL 45 03/12/2013   LDLCALC 39 03/12/2013   TRIG 60 03/12/2013    Significant Diagnostic Results in last 30 days:  Dg Chest 2 View  Result Date: 09/29/2016 CLINICAL DATA:  Pain following fall EXAM: CHEST  2 VIEW COMPARISON:  Chest radiograph Feb 13, 2016; chest CT Feb 13, 2016 FINDINGS: There is patchy opacity in the right base region concerning for early pneumonia. Lungs elsewhere are clear. Heart size and pulmonary vascularity are within normal limits. There is atherosclerotic calcification in the aorta. Patient is status post coronary artery bypass grafting. No bone lesions. IMPRESSION: Patchy opacity in right base concerning for early pneumonia. Lungs elsewhere clear. Stable cardiac silhouette. There is aortic atherosclerosis. Followup PA and lateral chest radiographs recommended in 3-4 weeks following trial of antibiotic therapy to ensure resolution and exclude underlying malignancy. Electronically Signed   By: Bretta Bang III M.D.   On: 09/29/2016 21:40   Dg Elbow Complete Left  Result Date: 10/02/2016 CLINICAL DATA:  Fall.  Elbow pain EXAM: LEFT ELBOW - COMPLETE 3+ VIEW COMPARISON:  None. FINDINGS: There is no evidence of fracture, dislocation, or joint effusion. There is no evidence of arthropathy or other focal bone abnormality. Soft tissues are unremarkable. IMPRESSION: Negative. Electronically Signed   By: Marlan Palau M.D.   On: 10/02/2016 18:27   Ct Head Wo Contrast  Result Date: 10/02/2016 CLINICAL DATA:  71 year old who fell today at a rehabilitation facility without loss of consciousness. Generalized neck pain. Current  history Parkinson's disease. EXAM: CT HEAD WITHOUT CONTRAST CT CERVICAL SPINE WITHOUT CONTRAST TECHNIQUE: Multidetector CT imaging of the head and cervical spine was performed following the standard protocol without intravenous contrast. Multiplanar CT image reconstructions of the cervical spine were also generated. COMPARISON:  CT head 07/26/2016, 11/05/2013. MRI brain 04/18/2013, 04/24/2009. No prior cervical spine imaging. FINDINGS: CT HEAD FINDINGS Brain: Head tilt in the gantry accounts for apparent asymmetry in the cerebral hemispheres. Moderate cortical atrophy, unchanged since 2015. No significant deep or cerebellar atrophy. Mild changes of small vessel disease of the white matter diffusely, unchanged since 2015. No mass lesion. No midline shift. No acute hemorrhage or hematoma. No extra-axial fluid collections. No evidence of acute infarction. Vascular: Mild bilateral carotid siphon and left vertebral artery atherosclerosis. Skull: No skull fracture or other focal osseous abnormality involving the skull. Sinuses/Orbits: Complete opacification of the visualized left maxillary sinus and near complete opacification of the ethmoid air cells bilaterally. Air-fluid level in the right maxillary sinus, the anterior right ethmoid air cells, and both sphenoid sinuses. Mucosal thickening involving both sphenoid sinuses and the left frontal sinus. Apparent prior partial right mastoidectomy. Bilateral mastoid air cells and bilateral middle ear cavities well-aerated. Other: None. CT CERVICAL SPINE FINDINGS Alignment: Anatomic. Skull base and vertebrae: No acute fractures. Lateral masses intact. Dens intact. C1-C2 articulation intact. Soft tissues and spinal canal: Borderline spinal stenosis at the C5-6 level. Disc levels: Moderate to severe degenerative disc disease and spondylosis at C5-6. Upper chest: Visualized lung apices well-aerated. Other: Bilateral carotid artery atherosclerosis. IMPRESSION: 1. No acute  intracranial abnormalities. 2. No cervical spine fractures identified. 3. 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 4. Degenerative disc disease and spondylosis at C5-6 with borderline spinal stenosis at this level. Electronically Signed  By: Hulan Saas M.D.   On: 10/02/2016 17:53   Ct Cervical Spine Wo Contrast  Result Date: 10/02/2016 CLINICAL DATA:  71 year old who fell today at a rehabilitation facility without loss of consciousness. Generalized neck pain. Current history Parkinson's disease. EXAM: CT HEAD WITHOUT CONTRAST CT CERVICAL SPINE WITHOUT CONTRAST TECHNIQUE: Multidetector CT imaging of the head and cervical spine was performed following the standard protocol without intravenous contrast. Multiplanar CT image reconstructions of the cervical spine were also generated. COMPARISON:  CT head 07/26/2016, 11/05/2013. MRI brain 04/18/2013, 04/24/2009. No prior cervical spine imaging. FINDINGS: CT HEAD FINDINGS Brain: Head tilt in the gantry accounts for apparent asymmetry in the cerebral hemispheres. Moderate cortical atrophy, unchanged since 2015. No significant deep or cerebellar atrophy. Mild changes of small vessel disease of the white matter diffusely, unchanged since 2015. No mass lesion. No midline shift. No acute hemorrhage or hematoma. No extra-axial fluid collections. No evidence of acute infarction. Vascular: Mild bilateral carotid siphon and left vertebral artery atherosclerosis. Skull: No skull fracture or other focal osseous abnormality involving the skull. Sinuses/Orbits: Complete opacification of the visualized left maxillary sinus and near complete opacification of the ethmoid air cells bilaterally. Air-fluid level in the right maxillary sinus, the anterior right ethmoid air cells, and both sphenoid sinuses. Mucosal thickening involving both sphenoid sinuses and the left frontal sinus.  Apparent prior partial right mastoidectomy. Bilateral mastoid air cells and bilateral middle ear cavities well-aerated. Other: None. CT CERVICAL SPINE FINDINGS Alignment: Anatomic. Skull base and vertebrae: No acute fractures. Lateral masses intact. Dens intact. C1-C2 articulation intact. Soft tissues and spinal canal: Borderline spinal stenosis at the C5-6 level. Disc levels: Moderate to severe degenerative disc disease and spondylosis at C5-6. Upper chest: Visualized lung apices well-aerated. Other: Bilateral carotid artery atherosclerosis. IMPRESSION: 1. No acute intracranial abnormalities. 2. No cervical spine fractures identified. 3. 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 4. Degenerative disc disease and spondylosis at C5-6 with borderline spinal stenosis at this level. Electronically Signed   By: Hulan Saas M.D.   On: 10/02/2016 17:53   Dg Hip Unilat W Or Wo Pelvis 2-3 Views Left  Result Date: 10/02/2016 CLINICAL DATA:  Pt was discharged from the hospital this morning for PNA. Pt fell at senior care facility hit his head, left hip and left elbow. Pt poor historian. EXAM: DG HIP (WITH OR WITHOUT PELVIS) 2-3V LEFT COMPARISON:  None. FINDINGS: Hips are located. No evidence of pelvic fracture or sacral fracture. Dedicated view of the LEFT hip demonstrates no femoral neck fracture. IMPRESSION: No pelvic fracture or LEFT hip fracture. Electronically Signed   By: Genevive Bi M.D.   On: 10/02/2016 18:10    Assessment/Plan 1. Community acquired pneumonia of left lower lobe of lung (HCC)  Resolved  Continue prn duonebs  Flu swab prior to discharge  2. Parkinson's disease (HCC)  Continue Nuplazid to HS per home regimen  Continue Levodopa-carbidopa per home regimen  Baclofen 5 mg po TID for spasms, rigidity  3. Malnutrition of moderate degree  Add Ensure clear BID between meals for nutritional  support  Continue Mirtazepine 7.5 mg po Q HS for appetite stimulation  4. Acute delirium  Continue seroquel 25 mg po Q am   Continue Seroquel 50 mg po Q HS  Continue Melatonin 3 mg po QHS  5. Skin tear of hand without complication, right, sequela  resolved  Patient is being discharged with the following home health  services:  Hospice  Patient is being discharged with the following durable medical equipment:  none  Patient has been advised to f/u with their PCP in 1-2 weeks to bring them up to date on their rehab stay.  Social services at facility was responsible for arranging this appointment.  Pt was provided with a 30 day supply of prescriptions for medications and refills must be obtained from their PCP.  For controlled substances, a more limited supply may be provided adequate until PCP appointment only.  Family/ staff Communication:   Total Time:  Documentation:  Face to Face:  Family/Phone:   Labs/tests ordered:  Flu swab  Medication list reviewed and assessed for continued appropriateness.  Brynda Rim, NP-C Geriatrics [facility] [id number], [place] [address] Cell Phone (Mon-Fri 8am-5pm):  [phone] On Call:  [phone] & follow prompts after 5pm & weekends Office Phone:  [phone] Office Fax:  [phone]

## 2016-10-29 ENCOUNTER — Ambulatory Visit: Payer: PPO

## 2017-04-27 DIAGNOSIS — F0281 Dementia in other diseases classified elsewhere with behavioral disturbance: Secondary | ICD-10-CM | POA: Diagnosis not present

## 2017-04-27 DIAGNOSIS — I25119 Atherosclerotic heart disease of native coronary artery with unspecified angina pectoris: Secondary | ICD-10-CM | POA: Diagnosis not present

## 2017-04-27 DIAGNOSIS — G20 Parkinson's disease: Secondary | ICD-10-CM | POA: Diagnosis not present

## 2017-04-27 DIAGNOSIS — I739 Peripheral vascular disease, unspecified: Secondary | ICD-10-CM | POA: Diagnosis not present

## 2017-06-27 DEATH — deceased

## 2018-03-08 IMAGING — CR DG CHEST 2V
1 series · 2 of 2 positions shown · non-contrast
Comparison: Chest radiograph February 13, 2016; chest CT February 13, 2016

CLINICAL DATA: Pain following fall

EXAM:
CHEST  2 VIEW

[Series 1: dg chest 2 view · 0.14mm/px · 2 of 2 slices shown]
[im 1/2]
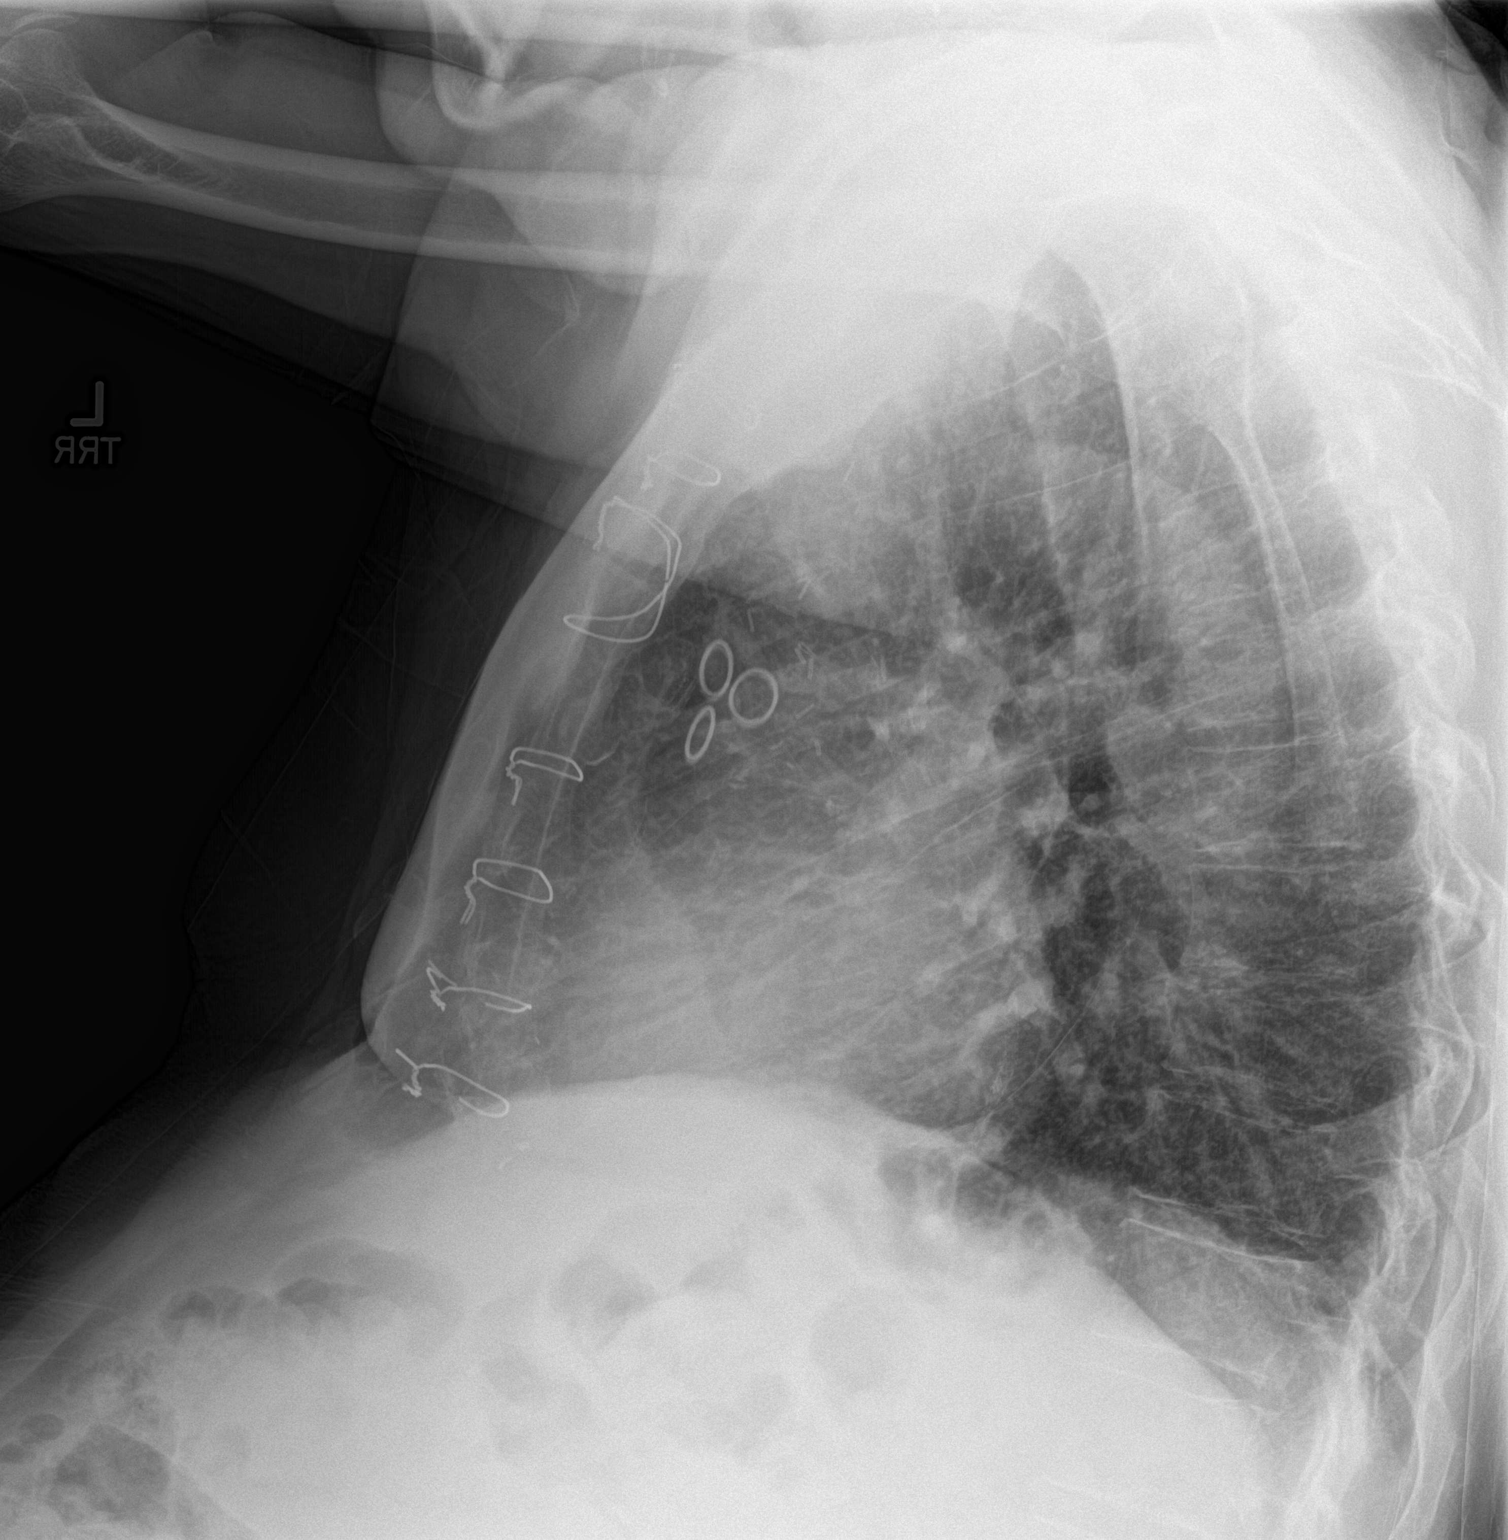
[im 2/2]
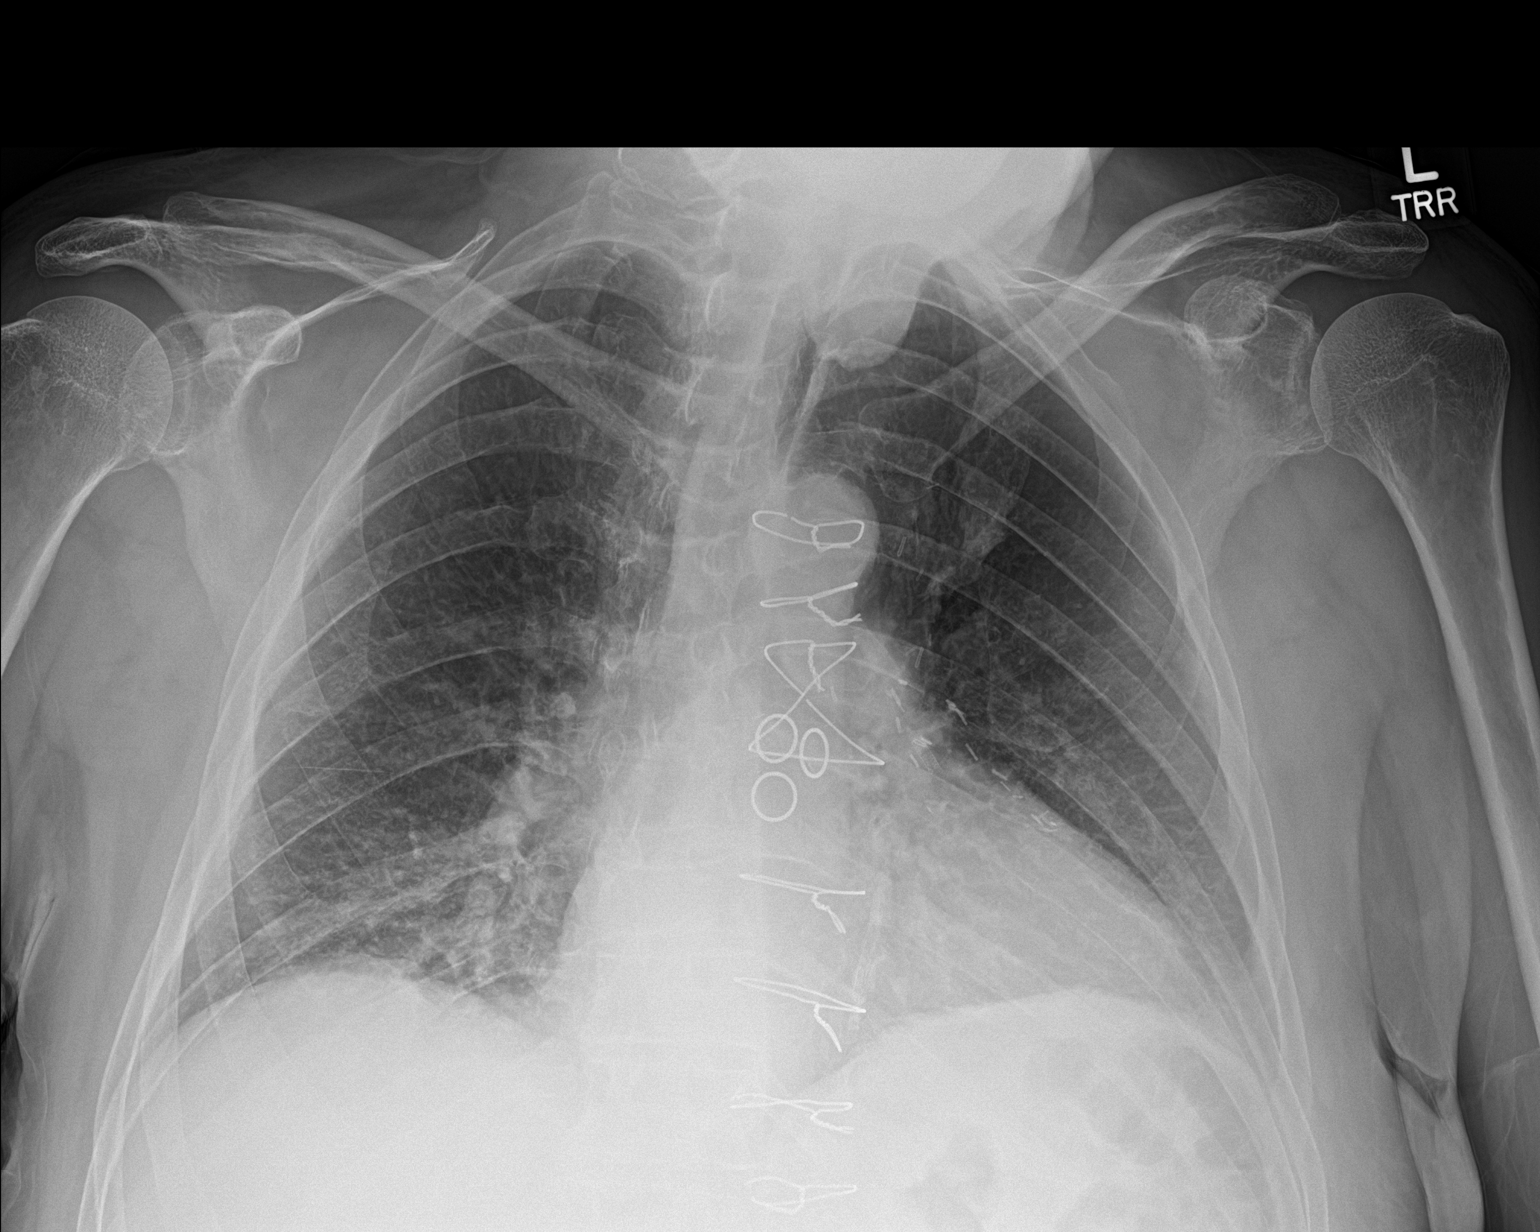

[2 of 2 positions shown; findings below may reference images not displayed]

FINDINGS: There is patchy opacity in the right base region concerning for
early pneumonia. Lungs elsewhere are clear. Heart size and pulmonary
vascularity are within normal limits. There is atherosclerotic
calcification in the aorta. Patient is status post coronary artery
bypass grafting. No bone lesions.
IMPRESSION: Patchy opacity in right base concerning for early pneumonia. Lungs
elsewhere clear. Stable cardiac silhouette. There is aortic
atherosclerosis.

Followup PA and lateral chest radiographs recommended in 3-4 weeks
following trial of antibiotic therapy to ensure resolution and
exclude underlying malignancy.

## 2018-03-11 IMAGING — CT CT CERVICAL SPINE W/O CM
4 of 7 series · 14 of 33 positions shown, 15 images · non-contrast
Comparison: CT head 07/26/2016, 11/05/2013. MRI brain 04/18/2013,
04/24/2009. No prior cervical spine imaging.

CLINICAL DATA: 70-year-old who fell today at a rehabilitation
facility without loss of consciousness. Generalized neck pain.
Current history Parkinson's disease.

EXAM:
CT HEAD WITHOUT CONTRAST
CT CERVICAL SPINE WITHOUT CONTRAST
TECHNIQUE: Multidetector CT imaging of the head and cervical spine was
performed following the standard protocol without intravenous
contrast. Multiplanar CT image reconstructions of the cervical spine
were also generated.

[Series 3: c spine soft · axial · 0.45mm/px · z∈[-274,-240]mm · 2 of 87 slices shown]
[im 18/87  soft-tissue]
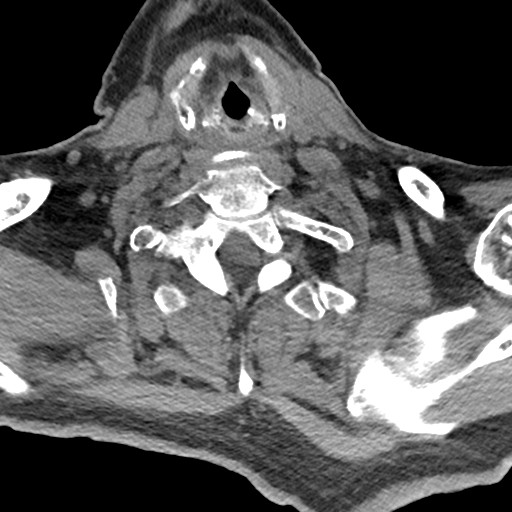
[im 35/87  soft-tissue]
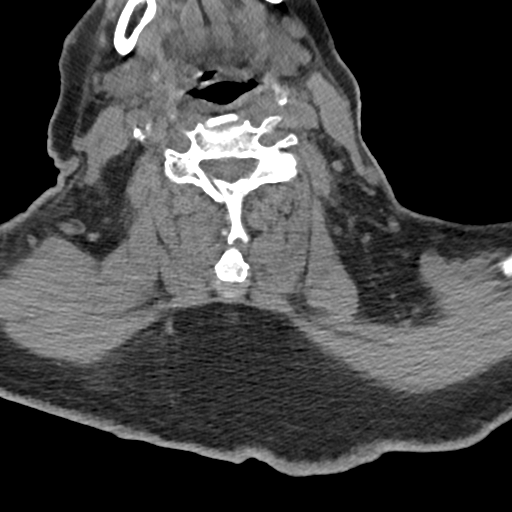

[Series 8: coronal soft tissue · coronal · 0.31mm/px · 3 of 71 slices shown]
[im 18/71  bone]
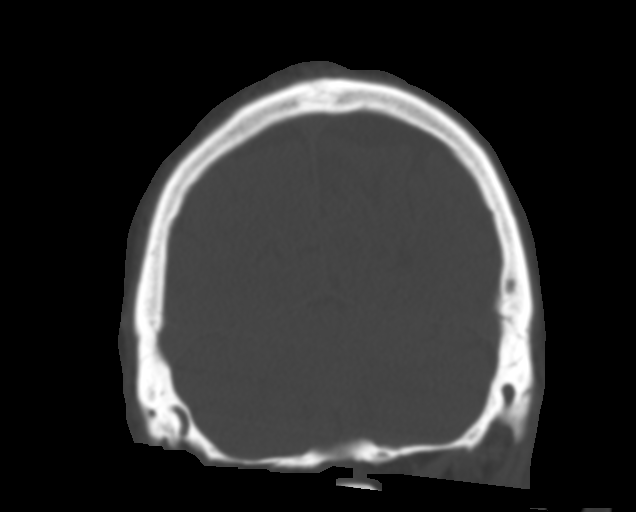
[im 36/71  bone]
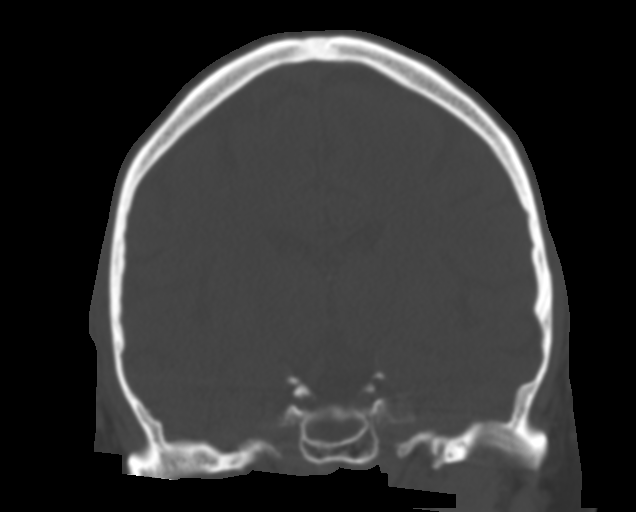
[im 53/71  bone]
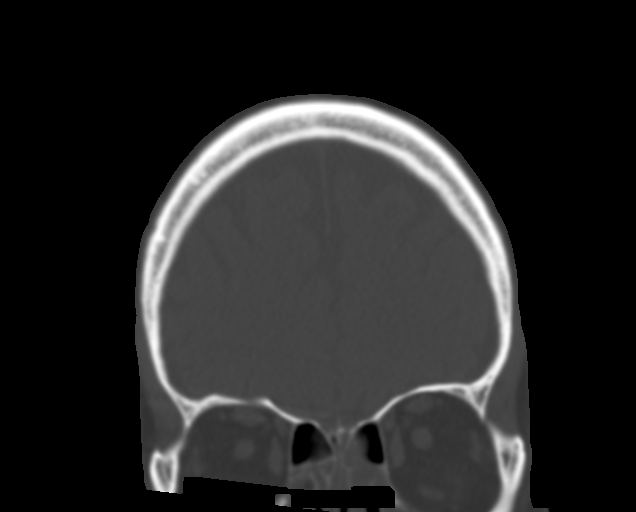

[Series 10: sagittal bone · sagittal · 0.25mm/px · 5 of 71 slices shown]
[im 12/71  bone]
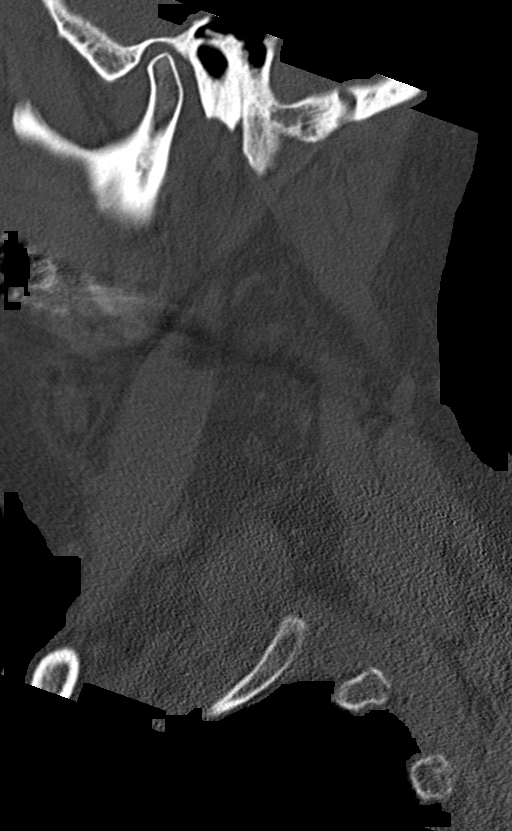
[im 24/71  bone]
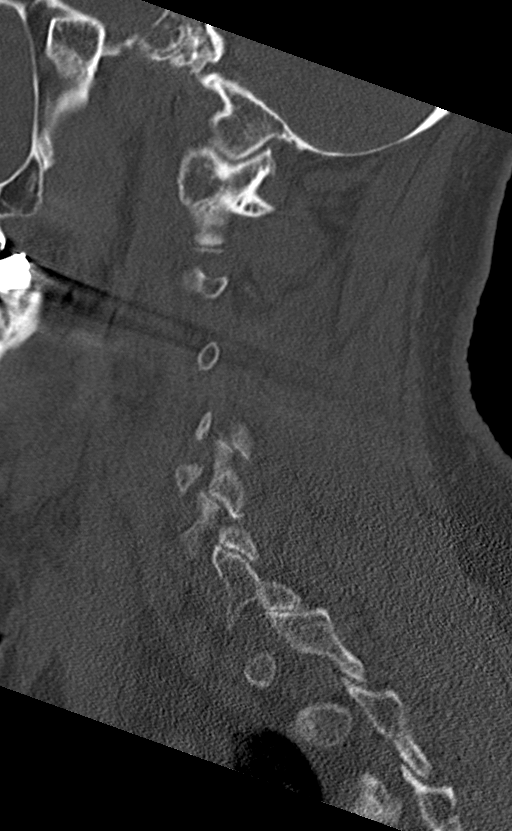
[im 36/71  bone]
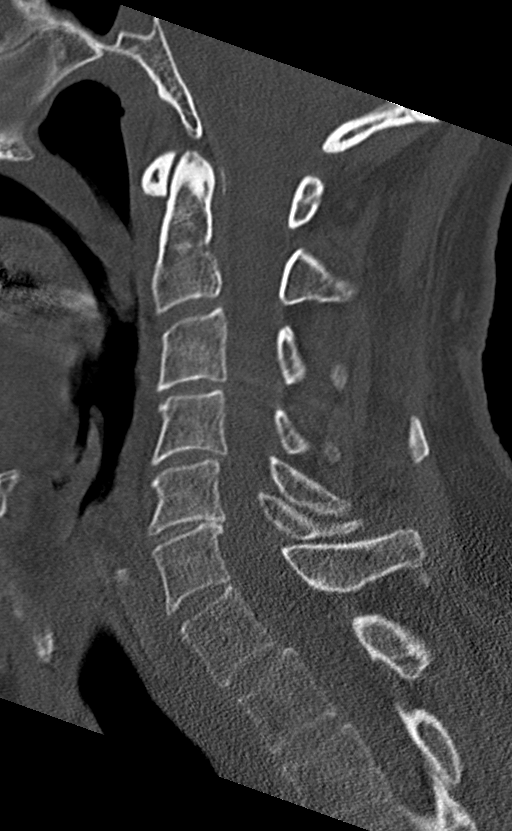
[im 47/71  bone]
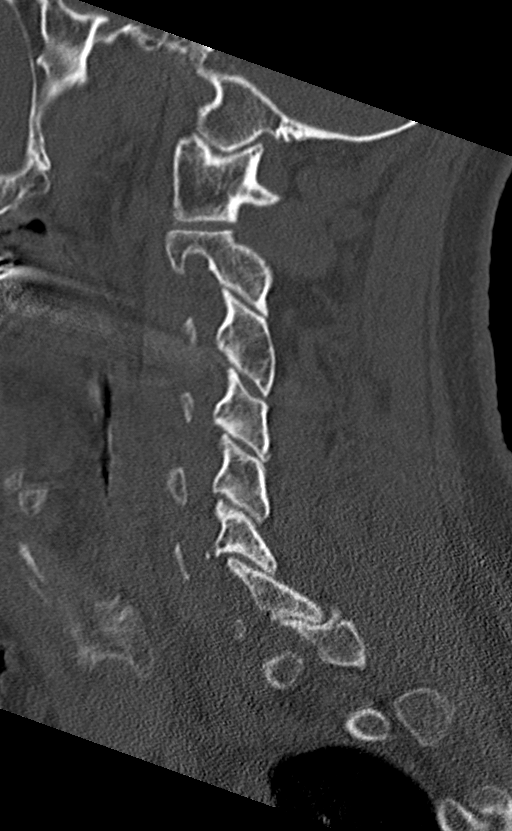
[im 59/71  bone]
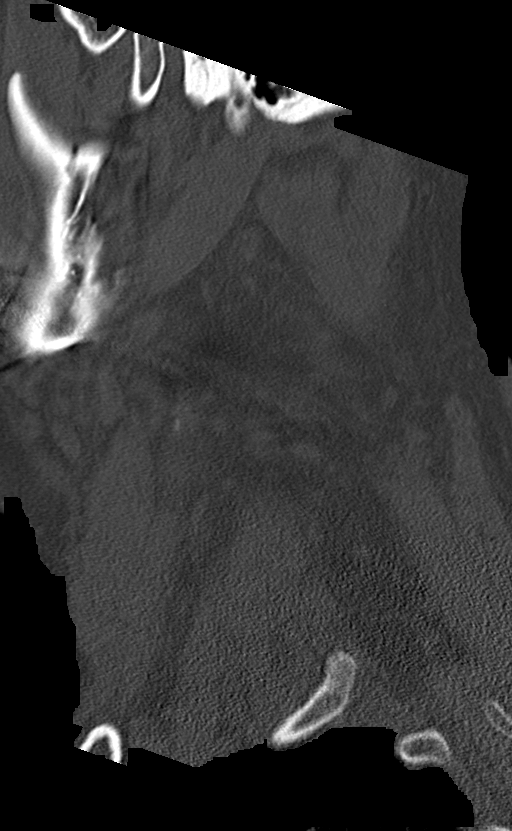

[Series 12: orthogonal bone · axial · 0.33mm/px · z∈[-315,-215]mm · 4 of 93 slices shown, 5 images]
[im 19/93  soft-tissue]
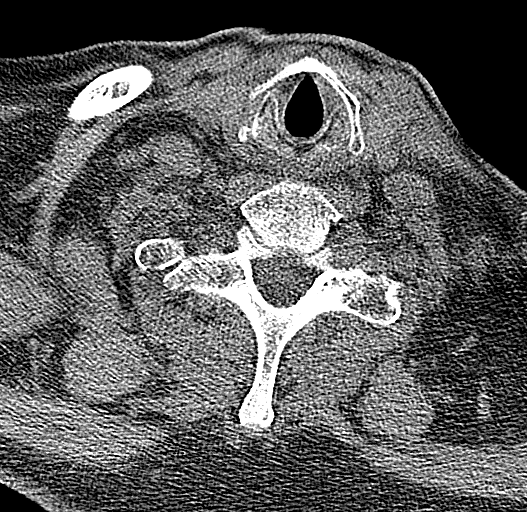
[im 19/93  bone]
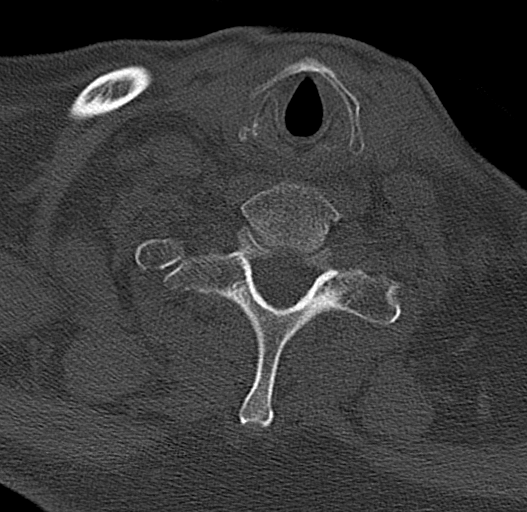
[im 37/93  bone]
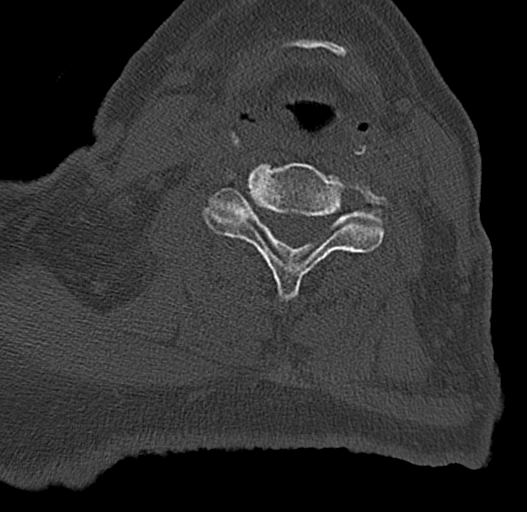
[im 56/93  bone]
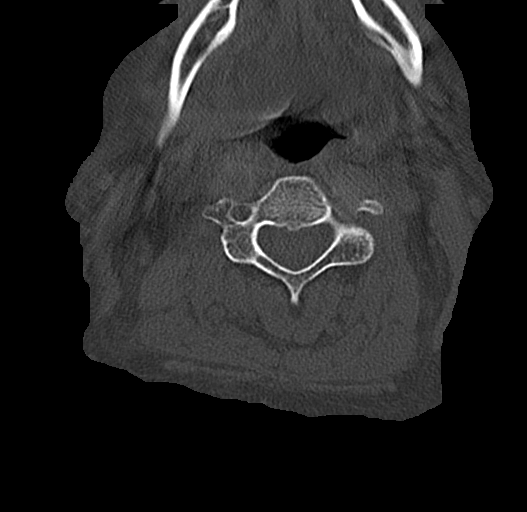
[im 74/93  bone]
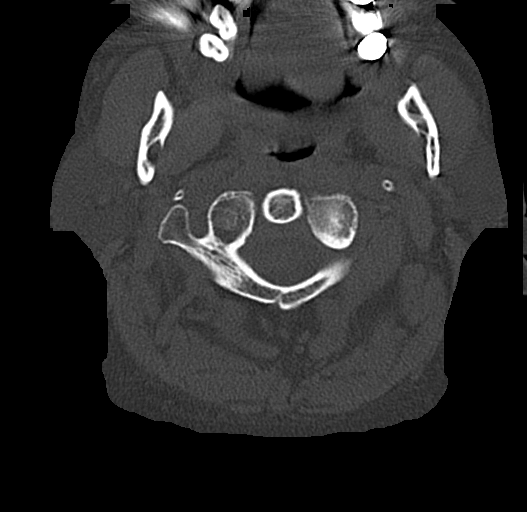

[14 of 33 positions shown; findings below may reference images not displayed]

FINDINGS: CT HEAD FINDINGS

Brain: Head tilt in the gantry accounts for apparent asymmetry in
the cerebral hemispheres. Moderate cortical atrophy, unchanged since
8749. No significant deep or cerebellar atrophy. Mild changes of
small vessel disease of the white matter diffusely, unchanged since
8749. No mass lesion. No midline shift. No acute hemorrhage or
hematoma. No extra-axial fluid collections. No evidence of acute
infarction.

Vascular: Mild bilateral carotid siphon and left vertebral artery
atherosclerosis.

Skull: No skull fracture or other focal osseous abnormality
involving the skull.

Sinuses/Orbits: Complete opacification of the visualized left
maxillary sinus and near complete opacification of the ethmoid air
cells bilaterally. Air-fluid level in the right maxillary sinus, the
anterior right ethmoid air cells, and both sphenoid sinuses. Mucosal
thickening involving both sphenoid sinuses and the left frontal
sinus. Apparent prior partial right mastoidectomy. Bilateral mastoid
air cells and bilateral middle ear cavities well-aerated.

Other: None.

CT CERVICAL SPINE FINDINGS

Alignment: Anatomic.

Skull base and vertebrae: No acute fractures. Lateral masses intact.
Dens intact. C1-C2 articulation intact.

Soft tissues and spinal canal: Borderline spinal stenosis at the
C5-6 level.

Disc levels: Moderate to severe degenerative disc disease and
spondylosis at C5-6.

Upper chest: Visualized lung apices well-aerated.

Other: Bilateral carotid artery atherosclerosis.
IMPRESSION: 1. No acute intracranial abnormalities.
2. No cervical spine fractures identified.
3. Acute superimposed upon chronic bilateral maxillary and ethmoid
sinusitis with chronic bilateral sphenoid and left frontal
sinusitis.
4. Degenerative disc disease and spondylosis at C5-6 with borderline
spinal stenosis at this level.
